# Patient Record
Sex: Male | Born: 1940 | Race: White | Hispanic: No | Marital: Married | Smoking: Former smoker
Health system: Southern US, Community
[De-identification: ages and names within clinical notes are randomized; demographics above are authoritative.]

## PROBLEM LIST (undated history)

## (undated) DIAGNOSIS — Z972 Presence of dental prosthetic device (complete) (partial): Secondary | ICD-10-CM

## (undated) DIAGNOSIS — K5010 Crohn's disease of large intestine without complications: Secondary | ICD-10-CM

## (undated) DIAGNOSIS — R51 Headache: Secondary | ICD-10-CM

## (undated) DIAGNOSIS — N400 Enlarged prostate without lower urinary tract symptoms: Secondary | ICD-10-CM

## (undated) DIAGNOSIS — K52832 Lymphocytic colitis: Secondary | ICD-10-CM

## (undated) DIAGNOSIS — G4700 Insomnia, unspecified: Secondary | ICD-10-CM

## (undated) DIAGNOSIS — B399 Histoplasmosis, unspecified: Secondary | ICD-10-CM

## (undated) DIAGNOSIS — R519 Headache, unspecified: Secondary | ICD-10-CM

## (undated) DIAGNOSIS — E785 Hyperlipidemia, unspecified: Secondary | ICD-10-CM

## (undated) DIAGNOSIS — K219 Gastro-esophageal reflux disease without esophagitis: Secondary | ICD-10-CM

## (undated) DIAGNOSIS — K5792 Diverticulitis of intestine, part unspecified, without perforation or abscess without bleeding: Secondary | ICD-10-CM

## (undated) HISTORY — PX: OTHER SURGICAL HISTORY: SHX169

## (undated) HISTORY — PX: EYE SURGERY: SHX253

## (undated) HISTORY — PX: ESOPHAGOGASTRODUODENOSCOPY: SHX1529

## (undated) HISTORY — PX: DIAGNOSTIC LAPAROSCOPY: SUR761

---

## 2009-02-03 ENCOUNTER — Ambulatory Visit: Payer: Self-pay | Admitting: Gastroenterology

## 2012-05-08 ENCOUNTER — Ambulatory Visit: Payer: Self-pay | Admitting: Gastroenterology

## 2014-06-27 DIAGNOSIS — K5010 Crohn's disease of large intestine without complications: Secondary | ICD-10-CM

## 2014-06-27 HISTORY — DX: Crohn's disease of large intestine without complications: K50.10

## 2015-02-28 ENCOUNTER — Emergency Department
Admission: EM | Admit: 2015-02-28 | Discharge: 2015-02-28 | Disposition: A | Discharge status: Home / Self Care | Payer: Medicare Other | Attending: Emergency Medicine | Admitting: Emergency Medicine

## 2015-02-28 ENCOUNTER — Encounter: Payer: Self-pay | Admitting: Emergency Medicine

## 2015-02-28 ENCOUNTER — Emergency Department: Payer: Medicare Other

## 2015-02-28 DIAGNOSIS — K6389 Other specified diseases of intestine: Secondary | ICD-10-CM | POA: Diagnosis not present

## 2015-02-28 DIAGNOSIS — R109 Unspecified abdominal pain: Secondary | ICD-10-CM | POA: Diagnosis present

## 2015-02-28 DIAGNOSIS — M549 Dorsalgia, unspecified: Secondary | ICD-10-CM | POA: Diagnosis not present

## 2015-02-28 HISTORY — DX: Diverticulitis of intestine, part unspecified, without perforation or abscess without bleeding: K57.92

## 2015-02-28 LAB — COMPREHENSIVE METABOLIC PANEL
ALK PHOS: 70 U/L (ref 38–126)
ALT: 18 U/L (ref 17–63)
ANION GAP: 9 (ref 5–15)
AST: 26 U/L (ref 15–41)
Albumin: 3.8 g/dL (ref 3.5–5.0)
BILIRUBIN TOTAL: 0.2 mg/dL — AB (ref 0.3–1.2)
BUN: 13 mg/dL (ref 6–20)
CALCIUM: 9.6 mg/dL (ref 8.9–10.3)
CO2: 28 mmol/L (ref 22–32)
Chloride: 101 mmol/L (ref 101–111)
Creatinine, Ser: 0.87 mg/dL (ref 0.61–1.24)
Glucose, Bld: 88 mg/dL (ref 65–99)
Potassium: 4.5 mmol/L (ref 3.5–5.1)
Sodium: 138 mmol/L (ref 135–145)
TOTAL PROTEIN: 6.7 g/dL (ref 6.5–8.1)

## 2015-02-28 LAB — LIPASE, BLOOD: Lipase: 23 U/L (ref 22–51)

## 2015-02-28 LAB — BRAIN NATRIURETIC PEPTIDE: B NATRIURETIC PEPTIDE 5: 165 pg/mL — AB (ref 0.0–100.0)

## 2015-02-28 MED ORDER — IOHEXOL 240 MG/ML SOLN
25.0000 mL | Freq: Once | INTRAMUSCULAR | Status: AC | PRN
  Administered 2015-02-28: 25 mL via ORAL

## 2015-02-28 MED ORDER — IOHEXOL 300 MG/ML  SOLN
100.0000 mL | Freq: Once | INTRAMUSCULAR | Status: AC | PRN
  Administered 2015-02-28: 100 mL via INTRAVENOUS

## 2015-02-28 NOTE — ED Provider Notes (Signed)
Time Seen: Approximately ----------------------------------------- 12:40 PM on 02/28/2015 -----------------------------------------   I have reviewed the triage notes  Chief Complaint: Abdominal Pain   History of Present Illness: Richard Craig is a 74 y.o. male who is been under treatment for diverticulitis. Patient states he had an episode 6-7 years ago which was his first flare up. He states he's been having similar symptoms now over the past month. He finished a 10 day course of Flagyl and Cipro and he was off of the medication for one to 2 weeks. He states on the 29th he was restarted on Cipro and Flagyl. States over the last several days that the pain and symptoms seem to be getting worse. General he describes back pain and crampy diffuse abdominal pain especially around the periumbilical area. He is not aware of any fever at home. He denies any dysuria, hematuria, or urinary frequency. He denies any melena or hematochezia. He denies any chest pain or pleuritic component. Past Medical History  Diagnosis Date  . Diverticulitis     There are no active problems to display for this patient.   Past Surgical History  Procedure Laterality Date  . Hystoplasmosis      Past Surgical History  Procedure Laterality Date  . Hystoplasmosis      No current outpatient prescriptions on file.  Allergies:  Review of patient's allergies indicates no known allergies.  Family History: No family history on file.  Social History: Social History  Substance Use Topics  . Smoking status: Never Smoker   . Smokeless tobacco: None  . Alcohol Use: No     Review of Systems:   10 point review of systems was performed and was otherwise negative:  Constitutional: No fever Eyes: No visual disturbances ENT: No sore throat, ear pain Cardiac: No chest pain Respiratory: No shortness of breath, wheezing, or stridor Abdomen: No abdominal pain, no vomiting, No diarrhea Endocrine: No weight loss,  No night sweats Extremities: No peripheral edema, cyanosis Skin: No rashes, easy bruising Neurologic: No focal weakness, trouble with speech or swollowing Urologic: No dysuria, Hematuria, or urinary frequency   Physical Exam:  ED Triage Vitals  Enc Vitals Group     BP 02/28/15 1159 159/80 mmHg     Pulse Rate 02/28/15 1159 66     Resp 02/28/15 1159 16     Temp 02/28/15 1159 97.7 F (36.5 C)     Temp Source 02/28/15 1159 Oral     SpO2 02/28/15 1159 99 %     Weight 02/28/15 1159 170 lb (77.111 kg)     Height 02/28/15 1159 5' 10"  (1.778 m)     Head Cir --      Peak Flow --      Pain Score 02/28/15 1159 2     Pain Loc --      Pain Edu? --      Excl. in [place]? --     General: Awake , Alert , and Oriented times 3; GCS 15 Head: Normal cephalic , atraumatic Eyes: Pupils equal , round, reactive to light Nose/Throat: No nasal drainage, patent upper airway without erythema or exudate.  Neck: Supple, Full range of motion, No anterior adenopathy or palpable thyroid masses Lungs: Clear to ascultation without wheezes , rhonchi, or rales Heart: Regular rate, regular rhythm without murmurs , gallops , or rubs Abdomen: Benign abdominal exam but no obvious rebound guarding or rigidity. Bowel sounds are positive somewhat hyperactive and symmetric in all 4 quadrants. No organomegaly .  Extremities: 2 plus symmetric pulses. No edema, clubbing or cyanosis Neurologic: normal ambulation, Motor symmetric without deficits, sensory intact Skin: warm, dry, no rashes   Labs:   All laboratory work was reviewed including any pertinent negatives or positives listed below:  Labs Reviewed  [place], BLOOD   CBC was pending at disposition.    Radiology  CT ABDOMEN AND PELVIS WITH CONTRAST  TECHNIQUE: Multidetector CT imaging of the abdomen and pelvis was performed using the standard protocol following bolus administration of intravenous  contrast.  CONTRAST: 14m OMNIPAQUE IOHEXOL 300 MG/ML SOLN  COMPARISON: None.  FINDINGS: Small calcified granulomata in the spleen. Small gallstones in the gallbladder, measuring up to 3 mm in diameter each. No gallbladder wall thickening or pericholecystic fluid.  Irregular mass-like area of soft tissue density in the right lower quadrant abdominal mesenteric knee with ill-defined increased density in the surrounding fat. This area measures 4.0 x 3.8 cm on image number 53. On coronal image number 50, this measures 3.4 cm in cephalocaudal dimension. 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 There is also eccentric wall thickening involving the cecum and inferior right colon.  No enlarged lymph nodes. Small bilateral renal cysts. 5 mm semi-solid nodule in the right lower lobe on image number 7. Two tiny subpleural nodules in the left lower lobe on image 8. The lungs are hyperexpanded at the lung bases with bullous changes.  Small liver cysts. Unremarkable the pancreas adrenal glands, ureters and urinary bladder. Mildly enlarged prostate gland. Right inguinal hernia containing fat. Multiple sigmoid colon diverticula without evidence of diverticulitis. Lumbar and lower thoracic spine degenerative changes.  IMPRESSION: 1. 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 This combination of findings is most compatible with an inflammatory process such as Crohn's disease. A neoplastic process, such  as a carcinoid tumor, is less likely. 2. 5 mm right lower lobe nodule and 2 tiny subpleural nodules in the left lower lobe. If the patient is at high risk for bronchogenic carcinoma, follow-up chest CT at 6-12 months is recommended. If the patient is at low risk for bronchogenic carcinoma, follow-up chest CT at 12 months is recommended. This recommendation follows the consensus statement: Guidelines for Management of Small Pulmonary Nodules Detected on CT Scans: A Statement from the [place] published in Radiology 2005;237:395-400. 3. Changes of COPD.        I personally reviewed the radiologic studies   Procedures:  Patient's stay was uneventful he tolerated his CAT scan well. There is no evidence of acute diverticulitis at this time.   ED Course: I reviewed the case with Dr. [facility] on call for unassigned patients. He was felt the next appropriate step would be a colonoscopy to see if we can visualize the lesion is located on the right side in his colon. Unfortunately this may be neoplastic in nature given the irregular surface etc. Patient is aware and discussed the differential and gave him a copy of the CAT scan reading the take with them. Then  advised to call on Tuesday for follow-up as soon as possible. Return here if he has any other new concerns such as bloody stool, persistent nausea or vomiting, or any other new concerns.    Assessment:  Colonic mass: An inflammatory bowel disease versus neoplastic     Plan:  Patient was advised to return immediately if condition worsens. Patient was advised to follow up with her primary care physician or other specialized physicians involved and in their current assessment.             Daymon Larsen, MD 02/28/15 [phone]

## 2015-02-28 NOTE — ED Notes (Signed)
Discharge instructions and follow-up care reviewed with patient and spouse. No questions or concerns at this time.

## 2015-02-28 NOTE — ED Notes (Signed)
Pt to ED c/o increased abdominal pain. States he's had diverticulitis 6-7 years ago, first flare-up started approx. 1 month ago. Pt finished 10-day course of Flagyl/Cipro, was off for 1-2 weeks, and recently restarted with increased dosages which did not relieve discomfort. Pain currently 2/10 to LLQ. No c/o n/v/d.

## 2015-02-28 NOTE — ED Notes (Signed)
Pt reports taking 2 doses of cipro and flagyl (for diverticulitis, hx of the same) and continued lower abdominal pain. Pt denies any blood in stool, reports some constipation. Pt sent here from [facility] for CT scan.

## 2015-03-06 ENCOUNTER — Encounter: Payer: Self-pay | Admitting: Anesthesiology

## 2015-03-06 ENCOUNTER — Encounter
Admission: RE | Disposition: A | Discharge status: Home Health | Payer: Self-pay | Source: Ambulatory Visit | Attending: Gastroenterology

## 2015-03-06 ENCOUNTER — Ambulatory Visit
Admission: RE | Admit: 2015-03-06 | Discharge: 2015-03-06 | Disposition: A | Discharge status: Home Health | Payer: Medicare Other | Source: Ambulatory Visit | Attending: Gastroenterology | Admitting: Gastroenterology

## 2015-03-06 ENCOUNTER — Ambulatory Visit: Payer: Medicare Other | Admitting: Anesthesiology

## 2015-03-06 DIAGNOSIS — N529 Male erectile dysfunction, unspecified: Secondary | ICD-10-CM | POA: Diagnosis not present

## 2015-03-06 DIAGNOSIS — K5730 Diverticulosis of large intestine without perforation or abscess without bleeding: Secondary | ICD-10-CM | POA: Insufficient documentation

## 2015-03-06 DIAGNOSIS — R1030 Lower abdominal pain, unspecified: Secondary | ICD-10-CM | POA: Insufficient documentation

## 2015-03-06 DIAGNOSIS — L308 Other specified dermatitis: Secondary | ICD-10-CM | POA: Insufficient documentation

## 2015-03-06 DIAGNOSIS — J449 Chronic obstructive pulmonary disease, unspecified: Secondary | ICD-10-CM | POA: Diagnosis not present

## 2015-03-06 DIAGNOSIS — Z8719 Personal history of other diseases of the digestive system: Secondary | ICD-10-CM | POA: Diagnosis not present

## 2015-03-06 DIAGNOSIS — K529 Noninfective gastroenteritis and colitis, unspecified: Secondary | ICD-10-CM | POA: Diagnosis not present

## 2015-03-06 DIAGNOSIS — K648 Other hemorrhoids: Secondary | ICD-10-CM | POA: Insufficient documentation

## 2015-03-06 DIAGNOSIS — Z79899 Other long term (current) drug therapy: Secondary | ICD-10-CM | POA: Diagnosis not present

## 2015-03-06 DIAGNOSIS — E785 Hyperlipidemia, unspecified: Secondary | ICD-10-CM | POA: Diagnosis not present

## 2015-03-06 DIAGNOSIS — K620 Anal polyp: Secondary | ICD-10-CM | POA: Insufficient documentation

## 2015-03-06 DIAGNOSIS — Z8371 Family history of colonic polyps: Secondary | ICD-10-CM | POA: Diagnosis not present

## 2015-03-06 DIAGNOSIS — Z800 Family history of malignant neoplasm of digestive organs: Secondary | ICD-10-CM | POA: Insufficient documentation

## 2015-03-06 DIAGNOSIS — Z7982 Long term (current) use of aspirin: Secondary | ICD-10-CM | POA: Diagnosis not present

## 2015-03-06 DIAGNOSIS — K621 Rectal polyp: Secondary | ICD-10-CM | POA: Insufficient documentation

## 2015-03-06 HISTORY — PX: COLONOSCOPY WITH PROPOFOL: SHX5780

## 2015-03-06 SURGERY — COLONOSCOPY WITH PROPOFOL
Anesthesia: General

## 2015-03-06 MED ORDER — PROPOFOL 10 MG/ML IV BOLUS
INTRAVENOUS | Status: DC | PRN
  Administered 2015-03-06: 30 mg via INTRAVENOUS
  Administered 2015-03-06: 70 mg via INTRAVENOUS

## 2015-03-06 MED ORDER — PROPOFOL INFUSION 10 MG/ML OPTIME
INTRAVENOUS | Status: DC | PRN
  Administered 2015-03-06: 120 ug/kg/min via INTRAVENOUS

## 2015-03-06 MED ORDER — SODIUM CHLORIDE 0.9 % IV SOLN
INTRAVENOUS | Status: DC
  Administered 2015-03-06: 1000 mL via INTRAVENOUS

## 2015-03-06 MED ORDER — LIDOCAINE HCL (CARDIAC) 20 MG/ML IV SOLN
INTRAVENOUS | Status: DC | PRN
  Administered 2015-03-06: 100 mg via INTRAVENOUS

## 2015-03-06 NOTE — H&P (Signed)
Outpatient short stay form Pre-procedure 03/06/2015 12:34 PM Lollie Sails MD  Primary Physician: Dr. [place] Pink  Reason for visit:  Colonoscopy  History of present illness:  Patient is a 74 year old male presenting after a bout of Donnell pain occurred a month ago. He was treated for diverticulitis with 2 courses of anti-biotics that did not seem to resolve the issue. He then went to the emergency room bout 6 days ago where CT scan was done that CT scan showing abnormality in the terminal ileum as well as cecum region. He did have a colonoscopy 03/01/2012 which showed only diverticulosis. There are several relatives however with colon cancer and history of colon polyps.  He tolerated his prep well. It is been several days since he took any aspirin products. Takes no anticoagulation drugs otherwise.    Current facility-administered medications:  .  0.9 %  sodium chloride infusion, , Intravenous, Continuous, Lollie Sails, MD, Last Rate: 20 mL/hr at 03/06/15 1157, 1,000 mL at 03/06/15 1157  Prescriptions prior to admission  Medication Sig Dispense Refill Last Dose  . aspirin 81 MG tablet Take 81 mg by mouth daily.     . clorazepate (TRANXENE) 15 MG tablet Take 15 mg by mouth 2 (two) times daily.     Marland Kitchen geriatric multivitamins-minerals (ELDERTONIC/GEVRABON) ELIX Take 15 mLs by mouth daily.     . hydrocortisone (ANUSOL-HC) 2.5 % rectal cream Place 1 application rectally 2 (two) times daily.     Ernestine Conrad FATTY ACIDS-PHYTOSTEROLS PO Take by mouth.     . simvastatin (ZOCOR) 40 MG tablet Take 40 mg by mouth daily.     . SUMAtriptan (IMITREX) 100 MG tablet Take 100 mg by mouth every 2 (two) hours as needed for migraine. May repeat in 2 hours if headache persists or recurs.   03/06/2015 at 0930  . tadalafil (CIALIS) 20 MG tablet Take 20 mg by mouth daily as needed for erectile dysfunction.        No Known Allergies   Past Medical History  Diagnosis Date  . Diverticulitis     Review  of systems:      Physical Exam    Heart and lungs: Regular rate and rhythm without rub or gallop, lungs are bilaterally clear    HEENT: Normocephalic atraumatic eyes are anicteric    Other: A rectal examination shows 1 small spot to the left of the anal orifice is minimally inflamed. However there is a large area of apparent fungal dermatitis around the rectum.    Pertinant exam for procedure: Soft nontender nondistended bowel sounds positive normoactive    Planned proceedures: Colonoscopy and indicated procedures. I have discussed the risks benefits and complications of procedures to include not limited to bleeding, infection, perforation and the risk of sedation and the patient wishes to proceed.  Lollie Sails, MD  12:38 PM 03/06/2015    Lollie Sails, MD Gastroenterology 03/06/2015  12:34 PM

## 2015-03-06 NOTE — Anesthesia Preprocedure Evaluation (Addendum)
Anesthesia Evaluation  Patient identified by MRN, date of birth, ID band Patient awake    Reviewed: Allergy & Precautions, H&P , NPO status , Patient's Chart, lab work & pertinent test results  History of Anesthesia Complications Negative for: history of anesthetic complications  Airway Mallampati: II  TM Distance: >3 FB Neck ROM: full    Dental  (+) Missing, Poor Dentition, Partial Lower, Partial Upper   Pulmonary neg shortness of breath, COPD,    Pulmonary exam normal breath sounds clear to auscultation       Cardiovascular Exercise Tolerance: Good (-) Past MI negative cardio ROS Normal cardiovascular exam Rhythm:regular Rate:Normal     Neuro/Psych negative neurological ROS  negative psych ROS   GI/Hepatic negative GI ROS, Neg liver ROS, GERD  Controlled,  Endo/Other  negative endocrine ROS  Renal/GU negative Renal ROS  negative genitourinary   Musculoskeletal   Abdominal   Peds  Hematology negative hematology ROS (+)   Anesthesia Other Findings Past Medical History:   Diverticulitis   HLD                      Intestinal mass   GERD                            Reproductive/Obstetrics negative OB ROS                            Anesthesia Physical Anesthesia Plan  ASA: III  Anesthesia Plan: General   Post-op Pain Management:    Induction:   Airway Management Planned:   Additional Equipment:   Intra-op Plan:   Post-operative Plan:   Informed Consent: I have reviewed the patients History and Physical, chart, labs and discussed the procedure including the risks, benefits and alternatives for the proposed anesthesia with the patient or authorized representative who has indicated his/her understanding and acceptance.   Dental Advisory Given  Plan Discussed with: Anesthesiologist, CRNA and Surgeon  Anesthesia Plan Comments:         Anesthesia Quick Evaluation

## 2015-03-06 NOTE — Anesthesia Postprocedure Evaluation (Signed)
  Anesthesia Post-op Note  Patient: Richard Craig  Procedure(s) Performed: Procedure(s): COLONOSCOPY WITH PROPOFOL (N/A)  Anesthesia type:General  Patient location: PACU  Post pain: Pain level controlled  Post assessment: Post-op Vital signs reviewed, Patient's Cardiovascular Status Stable, Respiratory Function Stable, Patent Airway and No signs of Nausea or vomiting  Post vital signs: Reviewed and stable  Last Vitals:  Filed Vitals:   03/06/15 1415  BP: 148/85  Pulse: 58  Temp:   Resp: 18    Level of consciousness: awake, alert  and patient cooperative  Complications: Patient with hoarse voice and sore throat which improves with drinking liquids.  Lungs clear.  No shortness of breath, no dermal involvement and patient is hemodynamically stable.   This is thought to be due to dry air flow from nasal canula and mouth breathing as patient was not intubated and airway was not instrumented.  Patient and wife instructed to contact a doctor or call 911 if this worsens or does not improve.  They voiced understanding.

## 2015-03-06 NOTE — Transfer of Care (Signed)
Immediate Anesthesia Transfer of Care Note  Patient: Richard Craig  Procedure(s) Performed: Procedure(s): COLONOSCOPY WITH PROPOFOL (N/A)  Patient Location: Endoscopy Unit  Anesthesia Type:General  Level of Consciousness: sedated  Airway & Oxygen Therapy: Patient Spontanous Breathing and Patient connected to nasal cannula oxygen  Post-op Assessment: Report given to RN and Post -op Vital signs reviewed and stable  Post vital signs: Reviewed and stable  Last Vitals:  Filed Vitals:   03/06/15 1143  BP: 152/84  Temp: 32.6 C    Complications: No apparent anesthesia complications

## 2015-03-06 NOTE — Op Note (Addendum)
[facility] Patient Name: Richard Craig Procedure Date: 03/06/2015 12:32 PM MRN: [id number] Account #: [id number] Date of Birth: May 09, 1941 Admit Type: Outpatient Age: 74 Room: [facility] ENDO ROOM 1 Gender: Male Note Status: Supervisor Override Procedure:         Colonoscopy Indications:       Lower abdominal pain, Abnormal CT of the GI tract Providers:         Lollie Sails, MD Referring MD:      Irven Easterly. Kary Kos, MD (Referring MD) Medicines:         Monitored Anesthesia Care Complications:     No immediate complications. Procedure:         Pre-Anesthesia Assessment:                    - ASA Grade Assessment: III - A patient with severe                     systemic disease.                    After obtaining informed consent, the colonoscope was                     passed under direct vision. Throughout the procedure, the                     patient's blood pressure, pulse, and oxygen saturations                     were monitored continuously. The Colonoscope was                     introduced through the anus and advanced to the the cecum,                     identified by the appendiceal orifice. The colonoscopy was                     performed with moderate difficulty due to poor bowel prep                     and significant looping. Successful completion of the                     procedure was aided by changing the patient to a supine                     position, using manual pressure and lavage. The patient                     tolerated the procedure well. Findings:      Multiple small to medium diverticula were found in the sigmoid colon and       in the descending colon.      Diffuse and scattered mild inflammation characterized by congestion       (edema) and aphthous ulcerations was found in the entire colon. Biopsies       for histology were taken with a cold forceps from the cecum, right colon       and left colon for evaluation  of microscopic colitis.      The digital rectal exam was normal.      The scope was advanced to the apparent appendiceal orifice. However  despite careful searching, an ileo-cecal opening could not be found. The       apparent cecum showed vascular fragility.      - three spots of mild submucosal ecchymosis were noted, at the distal       sigmoid and anal orifice without other defect.      The perianal exam findings include a perianal fungal rash.      A 4 mm polyp was found at 22 cm proximal to the anus. The polyp was       sessile. The polyp was removed with a cold biopsy forceps. Resection and       retrieval were complete.      Two sessile polyps were found in the rectum. The polyps were 2 mm in       size. These polyps were removed with a cold biopsy forceps. Resection       and retrieval were complete.      Non-bleeding internal hemorrhoids were found during retroflexion. The       hemorrhoids were small. Impression:        - Colitis suspected. Possible high grade stenosis in the                     ascending colon, unable tot advance further due to this. Recommendation:    - Discharge patient to home.                    - Perform an air contrast barium enema in 2 weeks.                    - Return to GI office in 2 weeks.                    - Diflucan (fluconazole) 100 mg PO daily for 5 days. Procedure Code(s): --- Professional ---                    [phone], Colonoscopy, flexible; with biopsy, single or                     multiple CPT copyright 2014 [employer]. All rights reserved. The codes documented in this report are preliminary and upon coder review may  be revised to meet current compliance requirements. Lollie Sails, MD 03/06/2015 1:45:23 PM This report has been signed electronically. Number of Addenda: 0 Note Initiated On: 03/06/2015 12:32 PM Scope Withdrawal Time: 0 hours 27 minutes 36 seconds  Total Procedure Duration: 0 hours 41 minutes 52  seconds       [facility]

## 2015-03-10 LAB — SURGICAL PATHOLOGY

## 2015-03-13 ENCOUNTER — Ambulatory Visit: Admit: 2015-03-13 | Payer: Self-pay | Admitting: Gastroenterology

## 2015-03-13 SURGERY — COLONOSCOPY WITH PROPOFOL
Anesthesia: General

## 2015-03-16 ENCOUNTER — Other Ambulatory Visit: Payer: Self-pay | Admitting: Gastroenterology

## 2015-03-16 DIAGNOSIS — R1030 Lower abdominal pain, unspecified: Secondary | ICD-10-CM

## 2015-03-20 ENCOUNTER — Encounter: Payer: Self-pay | Admitting: Gastroenterology

## 2015-03-20 ENCOUNTER — Other Ambulatory Visit: Payer: Self-pay | Admitting: Gastroenterology

## 2015-03-20 ENCOUNTER — Ambulatory Visit
Admission: RE | Admit: 2015-03-20 | Discharge: 2015-03-20 | Disposition: A | Discharge status: Home / Self Care | Payer: Medicare Other | Source: Ambulatory Visit | Attending: Gastroenterology | Admitting: Gastroenterology

## 2015-03-20 DIAGNOSIS — R1030 Lower abdominal pain, unspecified: Secondary | ICD-10-CM | POA: Diagnosis not present

## 2015-03-25 LAB — INFLAMMATORY BOWEL DISEASE-IBD

## 2015-03-31 DIAGNOSIS — K50819 Crohn's disease of both small and large intestine with unspecified complications: Secondary | ICD-10-CM | POA: Insufficient documentation

## 2015-06-12 ENCOUNTER — Emergency Department
Admission: EM | Admit: 2015-06-12 | Discharge: 2015-06-12 | Disposition: A | Discharge status: Home / Self Care | Payer: Medicare Other | Attending: Emergency Medicine | Admitting: Emergency Medicine

## 2015-06-12 ENCOUNTER — Encounter: Payer: Self-pay | Admitting: Medical Oncology

## 2015-06-12 ENCOUNTER — Emergency Department: Payer: Medicare Other

## 2015-06-12 DIAGNOSIS — Z79899 Other long term (current) drug therapy: Secondary | ICD-10-CM | POA: Diagnosis not present

## 2015-06-12 DIAGNOSIS — K5010 Crohn's disease of large intestine without complications: Secondary | ICD-10-CM | POA: Insufficient documentation

## 2015-06-12 DIAGNOSIS — K50119 Crohn's disease of large intestine with unspecified complications: Secondary | ICD-10-CM

## 2015-06-12 DIAGNOSIS — R109 Unspecified abdominal pain: Secondary | ICD-10-CM | POA: Diagnosis present

## 2015-06-12 DIAGNOSIS — Z7952 Long term (current) use of systemic steroids: Secondary | ICD-10-CM | POA: Insufficient documentation

## 2015-06-12 DIAGNOSIS — Z7982 Long term (current) use of aspirin: Secondary | ICD-10-CM | POA: Insufficient documentation

## 2015-06-12 LAB — CBC WITH DIFFERENTIAL/PLATELET
BASOS ABS: 0 10*3/uL (ref 0–0.1)
BASOS PCT: 0 %
Eosinophils Absolute: 0.1 10*3/uL (ref 0–0.7)
Eosinophils Relative: 1 %
HEMATOCRIT: 41.9 % (ref 40.0–52.0)
HEMOGLOBIN: 13.5 g/dL (ref 13.0–18.0)
LYMPHS PCT: 11 %
Lymphs Abs: 1.6 10*3/uL (ref 1.0–3.6)
MCH: 27.2 pg (ref 26.0–34.0)
MCHC: 32.3 g/dL (ref 32.0–36.0)
MCV: 84.2 fL (ref 80.0–100.0)
Monocytes Absolute: 1.2 10*3/uL — ABNORMAL HIGH (ref 0.2–1.0)
Monocytes Relative: 8 %
NEUTROS ABS: 11.7 10*3/uL — AB (ref 1.4–6.5)
NEUTROS PCT: 80 %
Platelets: 412 10*3/uL (ref 150–440)
RBC: 4.98 MIL/uL (ref 4.40–5.90)
RDW: 17.2 % — AB (ref 11.5–14.5)
WBC: 14.6 10*3/uL — AB (ref 3.8–10.6)

## 2015-06-12 LAB — URINALYSIS COMPLETE WITH MICROSCOPIC (ARMC ONLY)
BACTERIA UA: NONE SEEN
Bilirubin Urine: NEGATIVE
Glucose, UA: NEGATIVE mg/dL
Hgb urine dipstick: NEGATIVE
Ketones, ur: NEGATIVE mg/dL
LEUKOCYTES UA: NEGATIVE
Nitrite: NEGATIVE
PH: 8 (ref 5.0–8.0)
PROTEIN: NEGATIVE mg/dL
RBC / HPF: NONE SEEN RBC/hpf (ref 0–5)
SQUAMOUS EPITHELIAL / LPF: NONE SEEN
Specific Gravity, Urine: 1.006 (ref 1.005–1.030)
WBC UA: NONE SEEN WBC/hpf (ref 0–5)

## 2015-06-12 LAB — CBC
HCT: 43 % (ref 40.0–52.0)
Hemoglobin: 13.5 g/dL (ref 13.0–18.0)
MCH: 26.4 pg (ref 26.0–34.0)
MCHC: 31.5 g/dL — ABNORMAL LOW (ref 32.0–36.0)
MCV: 83.8 fL (ref 80.0–100.0)
PLATELETS: 457 10*3/uL — AB (ref 150–440)
RBC: 5.12 MIL/uL (ref 4.40–5.90)
RDW: 17.2 % — ABNORMAL HIGH (ref 11.5–14.5)
WBC: 15.8 10*3/uL — AB (ref 3.8–10.6)

## 2015-06-12 LAB — COMPREHENSIVE METABOLIC PANEL
ALT: 19 U/L (ref 17–63)
AST: 18 U/L (ref 15–41)
Albumin: 3.4 g/dL — ABNORMAL LOW (ref 3.5–5.0)
Alkaline Phosphatase: 89 U/L (ref 38–126)
Anion gap: 10 (ref 5–15)
BILIRUBIN TOTAL: 0.5 mg/dL (ref 0.3–1.2)
BUN: 7 mg/dL (ref 6–20)
CO2: 27 mmol/L (ref 22–32)
CREATININE: 0.83 mg/dL (ref 0.61–1.24)
Calcium: 9.9 mg/dL (ref 8.9–10.3)
Chloride: 101 mmol/L (ref 101–111)
GFR calc Af Amer: >60 mL/min (ref >60)
Glucose, Bld: 173 mg/dL — ABNORMAL HIGH (ref 65–99)
Potassium: 4 mmol/L (ref 3.5–5.1)
Sodium: 138 mmol/L (ref 135–145)
TOTAL PROTEIN: 7 g/dL (ref 6.5–8.1)

## 2015-06-12 LAB — LIPASE, BLOOD: Lipase: 18 U/L (ref 11–51)

## 2015-06-12 MED ORDER — MORPHINE SULFATE (PF) 4 MG/ML IV SOLN
6.0000 mg | Freq: Once | INTRAVENOUS | Status: AC
  Administered 2015-06-12: 6 mg via INTRAVENOUS
  Filled 2015-06-12: qty 1

## 2015-06-12 MED ORDER — PREDNISONE 20 MG PO TABS: 40.0000 mg | ORAL_TABLET | Freq: Every day | ORAL | Status: AC

## 2015-06-12 MED ORDER — HYDROMORPHONE HCL 1 MG/ML IJ SOLN
1.5000 mg | Freq: Once | INTRAMUSCULAR | Status: AC
  Administered 2015-06-12: 1.5 mg via INTRAVENOUS
  Filled 2015-06-12: qty 2

## 2015-06-12 MED ORDER — ONDANSETRON HCL 4 MG/2ML IJ SOLN
4.0000 mg | Freq: Once | INTRAMUSCULAR | Status: AC
Start: 2015-06-12 — End: 2015-06-12
  Administered 2015-06-12: 4 mg via INTRAVENOUS
  Filled 2015-06-12: qty 2

## 2015-06-12 MED ORDER — OXYCODONE-ACETAMINOPHEN 5-325 MG PO TABS
2.0000 | ORAL_TABLET | Freq: Once | ORAL | Status: AC
  Administered 2015-06-12: 2 via ORAL
  Filled 2015-06-12: qty 2

## 2015-06-12 MED ORDER — MORPHINE SULFATE (PF) 4 MG/ML IV SOLN
6.0000 mg | Freq: Once | INTRAVENOUS | Status: AC
  Administered 2015-06-12: 6 mg via INTRAVENOUS

## 2015-06-12 MED ORDER — MORPHINE SULFATE (PF) 4 MG/ML IV SOLN
INTRAVENOUS | Status: AC
  Filled 2015-06-12: qty 2

## 2015-06-12 MED ORDER — PREDNISONE 20 MG PO TABS
60.0000 mg | ORAL_TABLET | Freq: Once | ORAL | Status: AC
  Administered 2015-06-12: 60 mg via ORAL
  Filled 2015-06-12: qty 3

## 2015-06-12 MED ORDER — OXYCODONE-ACETAMINOPHEN 10-325 MG PO TABS: 1.0000 | ORAL_TABLET | Freq: Four times a day (QID) | ORAL | Status: AC | PRN

## 2015-06-12 MED ORDER — MORPHINE SULFATE (PF) 4 MG/ML IV SOLN
INTRAVENOUS | Status: AC
  Administered 2015-06-12: 6 mg via INTRAVENOUS
  Filled 2015-06-12: qty 1

## 2015-06-12 NOTE — ED Provider Notes (Signed)
[facility] Emergency Department Provider Note  ____________________________________________  Time seen: Approximately 7:37 PM  I have reviewed the triage vital signs and the nursing notes.   HISTORY  Chief Complaint Abdominal Pain    HPI Richard Craig is a 74 y.o. male who was diagnosed with Crohn's disease. He had a CT here and then another one at [facility] very recently. He was going to be started on Humira because of Crohn's disease is severe however the insurance company is apparently giving them a hassle and has not approved the Humira treatment at this point. Patient is having increasing right lower quadrant pain and does not feel well. Patient was on Ultram at home without is not working. Patient's pain has been located in the right lower quadrant and is the same in character as it had been.   Past Medical History  Diagnosis Date  . Diverticulitis     There are no active problems to display for this patient.   Past Surgical History  Procedure Laterality Date  . Hystoplasmosis    . Colonoscopy with propofol N/A 03/06/2015    Procedure: COLONOSCOPY WITH PROPOFOL;  Surgeon: Lollie Sails, MD;  Location: [facility] ENDOSCOPY;  Service: Endoscopy;  Laterality: N/A;    Current Outpatient Rx  Name  Route  Sig  Dispense  Refill  . aspirin 81 MG tablet   Oral   Take 81 mg by mouth daily.         . clorazepate (TRANXENE) 15 MG tablet   Oral   Take 15 mg by mouth 2 (two) times daily.         Marland Kitchen geriatric multivitamins-minerals (ELDERTONIC/GEVRABON) ELIX   Oral   Take 15 mLs by mouth daily.         . hydrocortisone (ANUSOL-HC) 2.5 % rectal cream   Rectal   Place 1 application rectally 2 (two) times daily.         Ernestine Conrad FATTY ACIDS-PHYTOSTEROLS PO   Oral   Take by mouth.         . oxyCODONE-acetaminophen (PERCOCET) 10-325 MG tablet   Oral   Take 1 tablet by mouth every 6 (six) hours as needed for pain (This prescription is stronger than  what you got in the hospital. Please take 1/2-1 pill every 6 hours as needed.).   20 tablet   0   . predniSONE (DELTASONE) 20 MG tablet   Oral   Take 2 tablets (40 mg total) by mouth daily.   22 tablet   0   . simvastatin (ZOCOR) 40 MG tablet   Oral   Take 40 mg by mouth daily.         . SUMAtriptan (IMITREX) 100 MG tablet   Oral   Take 100 mg by mouth every 2 (two) hours as needed for migraine. May repeat in 2 hours if headache persists or recurs.         . tadalafil (CIALIS) 20 MG tablet   Oral   Take 20 mg by mouth daily as needed for erectile dysfunction.           Allergies Review of patient's allergies indicates no known allergies.  No family history on file.  Social History Social History  Substance Use Topics  . Smoking status: Never Smoker   . Smokeless tobacco: None  . Alcohol Use: No    Review of Systems Constitutional: No fever/chills Eyes: No visual changes. ENT: No sore throat. Cardiovascular: Denies chest pain. Respiratory: Denies  shortness of breath. Gastrointestinal: See history of present illness I need to add that patient has been on MiraLAX because he found the Ultram to be constipating Genitourinary: Negative for dysuria. Musculoskeletal: Negative for back pain. Skin: Negative for rash. Neurological: Negative for headaches, focal weakness or numbness.  10-point ROS otherwise negative.  ____________________________________________   PHYSICAL EXAM:  VITAL SIGNS: ED Triage Vitals  Enc Vitals Group     BP 06/12/15 1503 150/100 mmHg     Pulse Rate 06/12/15 1503 102     Resp 06/12/15 1503 22     Temp 06/12/15 1503 97.8 F (36.6 C)     Temp Source 06/12/15 1503 Oral     SpO2 06/12/15 1503 99 %     Weight 06/12/15 1503 160 lb (72.576 kg)     Height 06/12/15 1503 5' 10"  (1.778 m)     Head Cir --      Peak Flow --      Pain Score 06/12/15 1503 10     Pain Loc --      Pain Edu? --      Excl. in [place]? --     Constitutional:  Alert and oriented. Well appearing and in no acute distress. Eyes: Conjunctivae are normal. PERRL. EOMI. Head: Atraumatic. Nose: No congestion/rhinnorhea. Mouth/Throat: Mucous membranes are moist.  Oropharynx non-erythematous. Neck: No stridor.  Cardiovascular: Normal rate, regular rhythm. Grossly normal heart sounds.  Good peripheral circulation. Respiratory: Normal respiratory effort.  No retractions. Lungs CTAB. Gastrointestinal: Soft and minimally tender even to deep palpation there is no tenderness percussion and there is no rebound. No distention. No abdominal bruits. No CVA tenderness. Musculoskeletal: No lower extremity tenderness nor edema.  No joint effusions. Neurologic:  Normal speech and language. No gross focal neurologic deficits are appreciated. No gait instability. Skin:  Skin is warm, dry and intact. No rash noted. Psychiatric: Mood and affect are normal. Speech and behavior are normal.  ____________________________________________   LABS (all labs ordered are listed, but only abnormal results are displayed)  Labs Reviewed  COMPREHENSIVE METABOLIC PANEL - Abnormal; Notable for the following:    Glucose, Bld 173 (*)    Albumin 3.4 (*)    All other components within normal limits  CBC - Abnormal; Notable for the following:    WBC 15.8 (*)    MCHC 31.5 (*)    RDW 17.2 (*)    Platelets 457 (*)    All other components within normal limits  URINALYSIS COMPLETEWITH MICROSCOPIC ([facility] ONLY) - Abnormal; Notable for the following:    Color, Urine YELLOW (*)    APPearance CLEAR (*)    All other components within normal limits  CBC WITH DIFFERENTIAL/PLATELET - Abnormal; Notable for the following:    WBC 14.6 (*)    RDW 17.2 (*)    Neutro Abs 11.7 (*)    Monocytes Absolute 1.2 (*)    All other components within normal limits  LIPASE, BLOOD   ____________________________________________  EKG   ____________________________________________  RADIOLOGY  X-rays read  by radiology and reviewed by me show 1-2 air-fluid levels no signs of obstruction. Patient's pain was controlled with pain medicine and his white blood count actually decreased while he was in the emergency room. I elected not to CT him as with his cousins disease I imagine he will get repeated CTs the next few years. ____________________________________________   PROCEDURES  Patient understands very well with I will treat him with prednisone for his Crohn's disease he will follow-up  with Dr. Gustavo Lah gastroenterology who recommended the prednisone. After sk[place]ie will see him in about a week. Patient knows to return for worse pain vomiting fever or any others worsening at all.  ____________________________________________   INITIAL IMPRESSION / ASSESSMENT AND PLAN / ED COURSE  Pertinent labs & imaging results that were available during my care of the patient were reviewed by me and considered in my medical decision making (see chart for details).   ____________________________________________   FINAL CLINICAL IMPRESSION(S) / ED DIAGNOSES  Final diagnoses:  Crohn's colitis, unspecified complication ([place])      Nena Polio, MD 06/12/15 2155

## 2015-06-12 NOTE — Discharge Instructions (Signed)
Crohn Disease Crohn disease is a long-lasting (chronic) disease that affects your gastrointestinal (GI) tract. It often causes irritation and swelling (inflammation) in your small intestine and the beginning of your large intestine. However, it can affect any part of your GI tract. Crohn disease is part of a group of illnesses that are known as inflammatory bowel disease (IBD). Crohn disease may start slowly and get worse over time. Symptoms may come and go. They may also disappear for months or even years at a time (remission). CAUSES The exact cause of Crohn disease is not known. It may be a response that causes your body's defense system (immune system) to mistakenly attack healthy cells and tissues (autoimmune response). Your genes and your environment may also play a role. RISK FACTORS You may be at greater risk for Crohn disease if you:  Have other family members with Crohn disease or another IBD.  Use any tobacco products, including cigarettes, chewing tobacco, or electronic cigarettes.  Are in your 17s.  Have [place] European ancestry. SIGNS AND SYMPTOMS The main signs and symptoms of Crohn disease involve your GI tract. These include:  Diarrhea.  Rectal bleeding.  An urgent need to move your bowels.  The feeling that you are not finished having a bowel movement.  Abdominal pain or cramping.  Constipation. General signs and symptoms of Crohn disease may also include:  Unexplained weight loss.  Fatigue.  Fever.  Nausea.  Loss of appetite.  Joint pain  Changes in vision.  Red bumps on your skin. DIAGNOSIS Your health care provider may suspect Crohn disease based on your symptoms and your medical history. Your health care provider will do a physical exam. You may need to see a health care provider who specializes in diseases of the digestive tract (gastroenterologist). You may also have tests to help your health care providers make a diagnosis. These may  include:  Blood tests.  Stool sample tests.  Imaging tests, such as X-rays and CT scans.  Tests to examine the inside of your intestines using a long, flexible tube that has a light and a camera on the end (endoscopy or colonoscopy).  A procedure to take tissue samples from inside your bowel (biopsy) to be examined under a microscope. TREATMENT  There is no cure for Crohn disease. Treatment will focus on managing your symptoms. Crohn disease affects each person differently. Your treatment may include:  Resting your bowels. Drinking only clear liquids or getting nutrition through an IV for a period of time gives your bowels a chance to heal because they are not passing stools.  Medicines. These may be used alone or in combination (combination therapy). These may include antibiotic medicines. You may be given medicines that help to:  Reduce inflammation.  Control your immune system activity.  Fight infections.  Relieve cramps and prevent diarrhea.  Control your pain.  Surgery. You may need surgery if:  Medicines and other treatments are no longer working.  You develop complications from severe Crohn disease.  A section of your intestine becomes so damaged that it needs to be removed. HOME CARE INSTRUCTIONS  Take medicines only as directed by your health care provider.  If you were prescribed an antibiotic medicine, finish it all even if you start to feel better.  Keep all follow-up visits as directed by your health care provider. This is important.  Talk with your health care provider about changing your diet. This may help your symptoms. Your health care provide may recommend changes, such  as:  Drinking more fluids.  Avoiding milk and other foods that contain lactose.  Eating a low-fat diet.  Avoiding high-fiber foods, such as popcorn and nuts.  Avoiding carbonated beverages, such as soda.  Eating smaller meals more often rather than eating large  meals.  Keeping a food diary to identify foods that make your symptoms better or worse.  Do not use any tobacco products, including cigarettes, chewing tobacco, or electronic cigarettes. If you need help quitting, ask your health care provider.  Limit alcohol intake to no more than 1 drink per day for nonpregnant women and 2 drinks per day for men. One drink equals 12 ounces of beer, 5 ounces of wine, or 1 ounces of hard liquor.  Exercise daily or as directed by your health care provider. SEEK MEDICAL CARE IF:  You have diarrhea, abdominal cramps, and other gastrointestinal problems that are present almost all of the time.  Your symptoms do not improve with treatment.  You continue to lose weight.  You develop a rash or sores on your skin.  You develop eye problems.  You have a fever.   Your symptoms get worse.  You develop new symptoms. SEEK IMMEDIATE MEDICAL CARE IF:  You have bloody diarrhea.  You develop severe abdominal pain.  You cannot pass stools.   This information is not intended to replace advice given to you by your health care provider. Make sure you discuss any questions you have with your health care provider.   Document Released: 03/23/2005 Document Revised: 07/04/2014 Document Reviewed: 01/29/2014 Elsevier Interactive Patient Education [employer].  I spoke with Dr. Gustavo Lah. He wants to you to take the prednisone 40 mg a day that's too of the pills I prescribed for you've. Do that every day until he can see you in a week to 10 days. Please call his office on Monday morning to make sure you have the appointment scheduled don't forget to tell them that you were in the hospital emergency room with a Crohn's exacerbation Please return at once for worse pain vomiting fever or feeling very sick or weak.

## 2015-06-12 NOTE — ED Notes (Signed)
Pt reports lower abd pain x 2 weeks, went to [facility] and was told that it was chron's disease, pt was prescribed meds but has not began them yet, pt denies n/v/d.

## 2015-12-04 ENCOUNTER — Encounter: Payer: Self-pay | Admitting: *Deleted

## 2015-12-07 ENCOUNTER — Encounter
Admission: RE | Disposition: A | Discharge status: Home / Self Care | Payer: Self-pay | Source: Ambulatory Visit | Attending: Gastroenterology

## 2015-12-07 ENCOUNTER — Encounter: Payer: Self-pay | Admitting: *Deleted

## 2015-12-07 ENCOUNTER — Ambulatory Visit: Payer: Medicare Other | Admitting: Anesthesiology

## 2015-12-07 ENCOUNTER — Ambulatory Visit
Admission: RE | Admit: 2015-12-07 | Discharge: 2015-12-07 | Disposition: A | Discharge status: Home / Self Care | Payer: Medicare Other | Source: Ambulatory Visit | Attending: Gastroenterology | Admitting: Gastroenterology

## 2015-12-07 DIAGNOSIS — Z79891 Long term (current) use of opiate analgesic: Secondary | ICD-10-CM | POA: Insufficient documentation

## 2015-12-07 DIAGNOSIS — D123 Benign neoplasm of transverse colon: Secondary | ICD-10-CM | POA: Insufficient documentation

## 2015-12-07 DIAGNOSIS — K621 Rectal polyp: Secondary | ICD-10-CM | POA: Insufficient documentation

## 2015-12-07 DIAGNOSIS — Z7982 Long term (current) use of aspirin: Secondary | ICD-10-CM | POA: Insufficient documentation

## 2015-12-07 DIAGNOSIS — K5730 Diverticulosis of large intestine without perforation or abscess without bleeding: Secondary | ICD-10-CM | POA: Insufficient documentation

## 2015-12-07 DIAGNOSIS — E785 Hyperlipidemia, unspecified: Secondary | ICD-10-CM | POA: Diagnosis not present

## 2015-12-07 DIAGNOSIS — K529 Noninfective gastroenteritis and colitis, unspecified: Secondary | ICD-10-CM | POA: Diagnosis not present

## 2015-12-07 DIAGNOSIS — K5080 Crohn's disease of both small and large intestine without complications: Secondary | ICD-10-CM | POA: Diagnosis not present

## 2015-12-07 DIAGNOSIS — N400 Enlarged prostate without lower urinary tract symptoms: Secondary | ICD-10-CM | POA: Insufficient documentation

## 2015-12-07 DIAGNOSIS — Z79899 Other long term (current) drug therapy: Secondary | ICD-10-CM | POA: Diagnosis not present

## 2015-12-07 DIAGNOSIS — K50919 Crohn's disease, unspecified, with unspecified complications: Secondary | ICD-10-CM

## 2015-12-07 HISTORY — DX: Histoplasmosis, unspecified: B39.9

## 2015-12-07 HISTORY — DX: Hyperlipidemia, unspecified: E78.5

## 2015-12-07 HISTORY — DX: Crohn's disease of large intestine without complications: K50.10

## 2015-12-07 HISTORY — DX: Headache: R51

## 2015-12-07 HISTORY — DX: Headache, unspecified: R51.9

## 2015-12-07 HISTORY — DX: Benign prostatic hyperplasia without lower urinary tract symptoms: N40.0

## 2015-12-07 HISTORY — DX: Insomnia, unspecified: G47.00

## 2015-12-07 HISTORY — DX: Lymphocytic colitis: K52.832

## 2015-12-07 HISTORY — PX: COLONOSCOPY WITH PROPOFOL: SHX5780

## 2015-12-07 SURGERY — COLONOSCOPY WITH PROPOFOL
Anesthesia: General

## 2015-12-07 MED ORDER — SODIUM CHLORIDE 0.9 % IV SOLN: INTRAVENOUS | Status: DC

## 2015-12-07 MED ORDER — FENTANYL CITRATE (PF) 100 MCG/2ML IJ SOLN
25.0000 ug | INTRAMUSCULAR | Status: DC | PRN
  Administered 2015-12-07: 50 ug via INTRAVENOUS

## 2015-12-07 MED ORDER — PROPOFOL 500 MG/50ML IV EMUL
INTRAVENOUS | Status: DC | PRN
  Administered 2015-12-07: 125 ug/kg/min via INTRAVENOUS

## 2015-12-07 MED ORDER — SODIUM CHLORIDE 0.9 % IV SOLN
INTRAVENOUS | Status: DC
  Administered 2015-12-07 (×2): via INTRAVENOUS

## 2015-12-07 MED ORDER — MIDAZOLAM HCL 2 MG/2ML IJ SOLN
INTRAMUSCULAR | Status: DC | PRN
  Administered 2015-12-07: 1 mg via INTRAVENOUS

## 2015-12-07 MED ORDER — PROPOFOL 10 MG/ML IV BOLUS
INTRAVENOUS | Status: DC | PRN
  Administered 2015-12-07: 20 mg via INTRAVENOUS
  Administered 2015-12-07: 50 mg via INTRAVENOUS

## 2015-12-07 MED ORDER — ONDANSETRON HCL 4 MG/2ML IJ SOLN: 4.0000 mg | Freq: Once | INTRAMUSCULAR | Status: DC | PRN

## 2015-12-07 NOTE — Anesthesia Preprocedure Evaluation (Signed)
Anesthesia Evaluation  Patient identified by MRN, date of birth, ID band Patient awake    Reviewed: Allergy & Precautions, H&P , NPO status , Patient's Chart, lab work & pertinent test results  History of Anesthesia Complications Negative for: history of anesthetic complications  Airway Mallampati: II  TM Distance: >3 FB Neck ROM: full    Dental  (+) Missing, Poor Dentition, Partial Lower, Partial Upper   Pulmonary neg pulmonary ROS, neg shortness of breath, COPD,    Pulmonary exam normal breath sounds clear to auscultation       Cardiovascular Exercise Tolerance: Good (-) Past MI negative cardio ROS Normal cardiovascular exam Rhythm:regular Rate:Normal     Neuro/Psych  Headaches, negative neurological ROS  negative psych ROS   GI/Hepatic negative GI ROS, Neg liver ROS, GERD  Controlled,crohns Dz diverticulosis   Endo/Other  negative endocrine ROS  Renal/GU negative Renal ROS  negative genitourinary   Musculoskeletal negative musculoskeletal ROS (+)   Abdominal   Peds negative pediatric ROS (+)  Hematology negative hematology ROS (+)   Anesthesia Other Findings Past Medical History:   Diverticulitis   HLD                      Intestinal mass   GERD                            Reproductive/Obstetrics negative OB ROS                             Anesthesia Physical  Anesthesia Plan  ASA: II  Anesthesia Plan: General   Post-op Pain Management:    Induction: Intravenous  Airway Management Planned: Nasal Cannula  Additional Equipment:   Intra-op Plan:   Post-operative Plan:   Informed Consent: I have reviewed the patients History and Physical, chart, labs and discussed the procedure including the risks, benefits and alternatives for the proposed anesthesia with the patient or authorized representative who has indicated his/her understanding and acceptance.   Dental  advisory given  Plan Discussed with: CRNA and Surgeon  Anesthesia Plan Comments:        Anesthesia Quick Evaluation

## 2015-12-07 NOTE — Anesthesia Procedure Notes (Signed)
Date/Time: 12/07/2015 7:59 AM Performed by: Nelda Marseille Pre-anesthesia Checklist: Patient identified, Emergency Drugs available, Suction available, Patient being monitored and Timeout performed Oxygen Delivery Method: Nasal cannula

## 2015-12-07 NOTE — H&P (Signed)
Outpatient short stay form Pre-procedure 12/07/2015 7:50 AM Lollie Sails MD  Primary Physician: Dr. [place] Pink  Reason for visit:  Colonoscopy  History of present illness:  Patient is a 75 year old male presenting today for colonoscopy. He has recent diagnosis of Crohn's disease. His had extensive evaluation along the finding of possible either the TI were the ascending colon. Only on Humira having his initial doses in February 2017. He states that his pain is resolved. He has normal bowel habits. He does take a 81 mg aspirin but he is held that for about a week now. He takes no other blood thinning agents or aspirin products. He is presenting today for reevaluation of the colon. He tolerated his prep well.    Current facility-administered medications:  .  0.9 %  sodium chloride infusion, , Intravenous, Continuous, Lollie Sails, MD, Last Rate: 20 mL/hr at 12/07/15 0721 .  0.9 %  sodium chloride infusion, , Intravenous, Continuous, Lollie Sails, MD .  fentaNYL (SUBLIMAZE) injection 25 mcg, 25 mcg, Intravenous, Q5 min PRN, Alvin Critchley, MD .  ondansetron [facility]) injection 4 mg, 4 mg, Intravenous, Once PRN, Alvin Critchley, MD  Prescriptions prior to admission  Medication Sig Dispense Refill Last Dose  . Adalimumab (HUMIRA PEN-CROHNS STARTER [place]) Inject into the skin.     Marland Kitchen aspirin 81 MG tablet Take 81 mg by mouth daily.   Past Week at Unknown time  . OMEGA FATTY ACIDS-PHYTOSTEROLS PO Take by mouth.   Past Week at Unknown time  . SUMAtriptan (IMITREX) 100 MG tablet Take 100 mg by mouth every 2 (two) hours as needed for migraine. May repeat in 2 hours if headache persists or recurs.   Past Week at Unknown time  . clorazepate (TRANXENE) 15 MG tablet Take 15 mg by mouth 2 (two) times daily.     Marland Kitchen geriatric multivitamins-minerals (ELDERTONIC/GEVRABON) ELIX Take 15 mLs by mouth daily.     . hydrocortisone (ANUSOL-HC) 2.5 % rectal cream Place 1 application rectally 2 (two) times daily.      Marland Kitchen oxyCODONE-acetaminophen (PERCOCET) 10-325 MG tablet Take 1 tablet by mouth every 6 (six) hours as needed for pain (This prescription is stronger than what you got in the hospital. Please take 1/2-1 pill every 6 hours as needed.). 20 tablet 0   . predniSONE (DELTASONE) 20 MG tablet Take 2 tablets (40 mg total) by mouth daily. (Patient not taking: Reported on 12/07/2015) 22 tablet 0 Not Taking at Unknown time  . simvastatin (ZOCOR) 40 MG tablet Take 40 mg by mouth daily.     . tadalafil (CIALIS) 20 MG tablet Take 20 mg by mouth daily as needed for erectile dysfunction.        No Known Allergies   Past Medical History  Diagnosis Date  . Diverticulitis   . BPH (benign prostatic hyperplasia)   . Crohn's colitis ([place]) 2016  . Focal lymphocytic colitis   . Headache   . Histoplasmosis   . Hyperlipemia   . Insomnia     Review of systems:      Physical Exam    Heart and lungs: Regular rate and rhythm without rub or gallop, lungs are bilaterally clear.    HEENT: Normocephalic atraumatic eyes are anicteric    Other:     Pertinant exam for procedure: Soft nontender nondistended bowel sounds positive normoactive.    Planned proceedures: Colonoscopy and indicated procedures. I have discussed the risks benefits and complications of procedures to include not limited to  bleeding, infection, perforation and the risk of sedation and the patient wishes to proceed.    Lollie Sails, MD Gastroenterology 12/07/2015  7:50 AM

## 2015-12-07 NOTE — Op Note (Addendum)
[facility] Gastroenterology Patient Name: Richard Craig Procedure Date: 12/07/2015 7:49 AM MRN: [id number] Account #: [id number] Date of Birth: 1940-07-07 Admit Type: Outpatient Age: 75 Room: [facility] ENDO ROOM 3 Gender: Male Note Status: Supervisor Override Procedure:            Colonoscopy Indications:          Follow-up of Crohn's disease of the small bowel and                        colon Providers:            Lollie Sails, MD Referring MD:         Irven Easterly. Kary Kos, MD (Referring MD) Medicines:            Monitored Anesthesia Care Complications:        No immediate complications. Procedure:            Pre-Anesthesia Assessment:                       - ASA Grade Assessment: II - A patient with mild                        systemic disease.                       After obtaining informed consent, the colonoscope was                        passed under direct vision. Throughout the procedure,                        the patient's blood pressure, pulse, and oxygen                        saturations were monitored continuously. The                        Colonoscope was introduced through the anus and                        advanced to the the cecum, identified by the ileocecal                        valve. The colonoscopy was performed without                        difficulty. The patient tolerated the procedure well.                        The quality of the bowel preparation was fair. Findings:      Multiple small to medium-mouthed diverticula were found in the sigmoid       colon and distal descending colon.      The scope was advanced to the ascending colon. There is the appearance       of a stenosis in the mid ascending, both by endoscopic appearance and       location through the abdomen, this is only about 5 mm in diameter and       was not passed. This area is likely scarred from the Crohns disease and       could  also represent a narrowed stenotic  valve. I did not press into       this stenosis when it would not easily open due to th variablility of       anatomy. There is minimal inflamation throughout the colon.      Biopsies for histology were taken with a cold forceps from the       ascending/possible neocecum colon, transverse colon, descending colon,       sigmoid colon and rectum for evaluation of microscopic colitis.      There were small polypoid structures removed from the sigmoid and rectum       and placed into separate jars.      The digital rectal exam was normal. Impression:           - Preparation of the colon was fair.                       - Diverticulosis in the sigmoid colon and in the distal                        descending colon.                       - Biopsies were taken with a cold forceps from the                        ascending colon, transverse colon, descending colon,                        sigmoid colon and rectum for evaluation of microscopic                        colitis. Recommendation:       - Discharge patient to home.                       - Await pathology results.                       - Return to GI clinic in 3 weeks. Procedure Code(s):    --- Professional ---                       [phone], Colonoscopy, flexible; with biopsy, single or                        multiple Diagnosis Code(s):    --- Professional ---                       K50.80, Crohn's disease of both small and large                        intestine without complications                       K57.30, Diverticulosis of large intestine without                        perforation or abscess without bleeding CPT copyright 2016 American Medical Association. All rights reserved. The codes documented in this report are preliminary and upon coder review may  be revised to meet current compliance requirements. Lollie Sails,  MD 12/07/2015 8:38:14 AM This report has been signed electronically. Number of Addenda: 0 Note Initiated On:  12/07/2015 7:49 AM Scope Withdrawal Time: 0 hours 17 minutes 29 seconds  Total Procedure Duration: 0 hours 29 minutes 40 seconds       [facility]

## 2015-12-07 NOTE — Transfer of Care (Signed)
Immediate Anesthesia Transfer of Care Note  Patient: Richard Craig  Procedure(s) Performed: Procedure(s): COLONOSCOPY WITH PROPOFOL (N/A)  Patient Location: PACU  Anesthesia Type:General  Level of Consciousness: sedated  Airway & Oxygen Therapy: Patient Spontanous Breathing and Patient connected to nasal cannula oxygen  Post-op Assessment: Report given to RN and Post -op Vital signs reviewed and stable  Post vital signs: Reviewed and stable  Last Vitals:  Filed Vitals:   12/07/15 0707  BP: 140/77  Pulse: 71  Temp: 36.3 C  Resp: 18    Last Pain: There were no vitals filed for this visit.       Complications: No apparent anesthesia complications

## 2015-12-08 LAB — SURGICAL PATHOLOGY

## 2015-12-09 ENCOUNTER — Encounter: Payer: Self-pay | Admitting: Gastroenterology

## 2015-12-09 NOTE — Anesthesia Postprocedure Evaluation (Signed)
Anesthesia Post Note  Patient: Richard Craig  Procedure(s) Performed: Procedure(s) (LRB): COLONOSCOPY WITH PROPOFOL (N/A)  Patient location during evaluation: PACU Anesthesia Type: General Level of consciousness: awake and alert and oriented Pain management: pain level controlled Vital Signs Assessment: post-procedure vital signs reviewed and stable Respiratory status: spontaneous breathing Cardiovascular status: blood pressure returned to baseline Anesthetic complications: no    Last Vitals:  Filed Vitals:   12/07/15 0850 12/07/15 0900  BP: 123/80 131/94  Pulse: 55 58  Temp:    Resp: 14 12    Last Pain:  Filed Vitals:   12/08/15 0739  PainSc: 0-No pain                 Ellerie Arenz

## 2015-12-14 ENCOUNTER — Other Ambulatory Visit: Payer: Self-pay | Admitting: Gastroenterology

## 2015-12-14 DIAGNOSIS — K50019 Crohn's disease of small intestine with unspecified complications: Secondary | ICD-10-CM

## 2015-12-15 ENCOUNTER — Ambulatory Visit
Admission: RE | Admit: 2015-12-15 | Discharge: 2015-12-15 | Disposition: A | Discharge status: Home / Self Care | Payer: Medicare Other | Source: Ambulatory Visit | Attending: Gastroenterology | Admitting: Gastroenterology

## 2015-12-15 DIAGNOSIS — K50919 Crohn's disease, unspecified, with unspecified complications: Secondary | ICD-10-CM | POA: Diagnosis not present

## 2015-12-15 DIAGNOSIS — I700 Atherosclerosis of aorta: Secondary | ICD-10-CM | POA: Insufficient documentation

## 2015-12-15 DIAGNOSIS — K50019 Crohn's disease of small intestine with unspecified complications: Secondary | ICD-10-CM

## 2015-12-15 DIAGNOSIS — K8020 Calculus of gallbladder without cholecystitis without obstruction: Secondary | ICD-10-CM | POA: Diagnosis not present

## 2015-12-15 DIAGNOSIS — J439 Emphysema, unspecified: Secondary | ICD-10-CM | POA: Diagnosis not present

## 2015-12-15 LAB — POCT I-STAT CREATININE: Creatinine, Ser: 1.1 mg/dL (ref 0.61–1.24)

## 2015-12-15 MED ORDER — IOPAMIDOL (ISOVUE-300) INJECTION 61%
100.0000 mL | Freq: Once | INTRAVENOUS | Status: AC | PRN
  Administered 2015-12-15: 100 mL via INTRAVENOUS

## 2016-09-19 IMAGING — CT CT ABD-PELV W/ CM
2 of 5 series · 15 of 46 positions shown, 17 images · IV contrast (omnipaque)
Comparison: None.

CLINICAL DATA: Increasing left lower quadrant abdominal pain for
the past month. History of diverticulitis.

EXAM:
CT ABDOMEN AND PELVIS WITH CONTRAST
TECHNIQUE: Multidetector CT imaging of the abdomen and pelvis was performed
using the standard protocol following bolus administration of
intravenous contrast.
CONTRAST:  100mL OMNIPAQUE IOHEXOL 300 MG/ML  SOLN

[Series 2: routine abd pel with · axial · 0.67mm/px · z∈[-962,-557]mm · 12 of 93 slices shown, 14 images]
[im 6/93  soft-tissue]
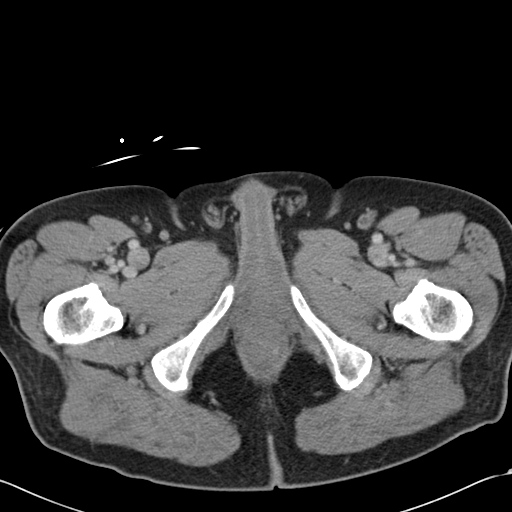
[im 6/93  bone]
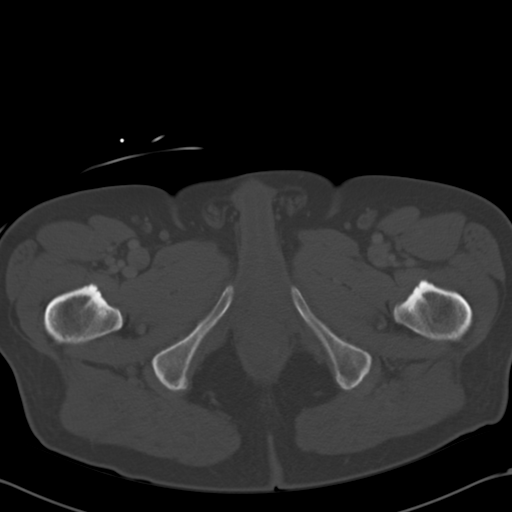
[im 17/93  soft-tissue]
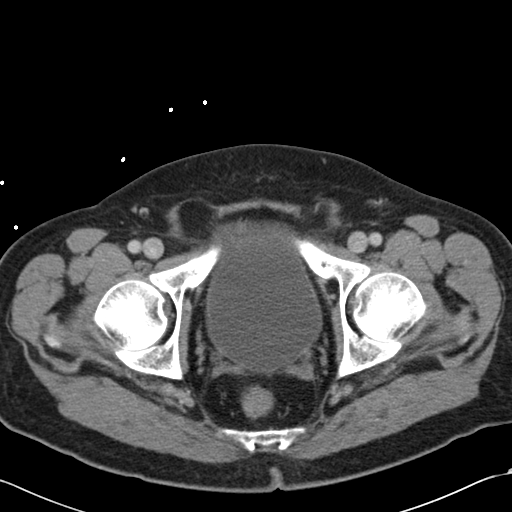
[im 22/93  soft-tissue]
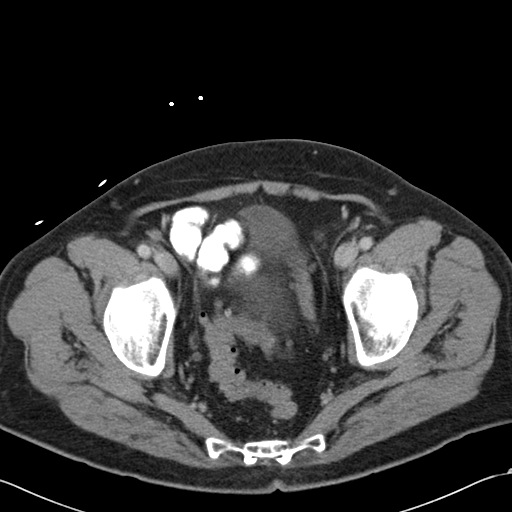
[im 28/93  soft-tissue]
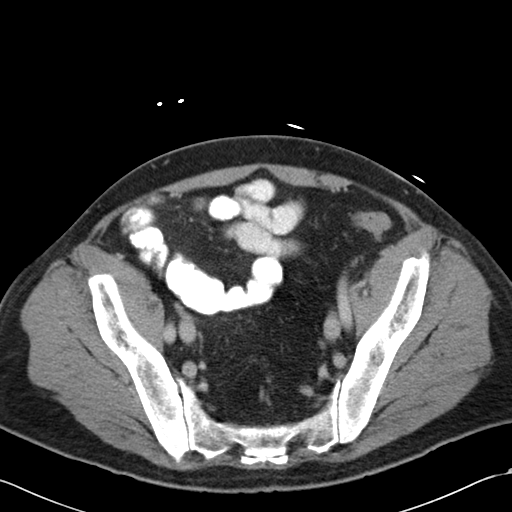
[im 38/93  soft-tissue]
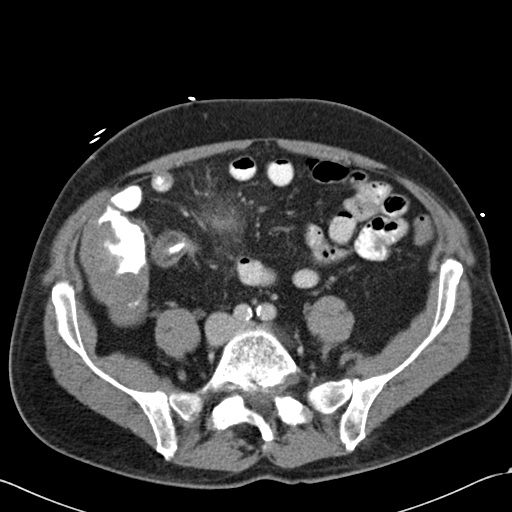
[im 44/93  soft-tissue]
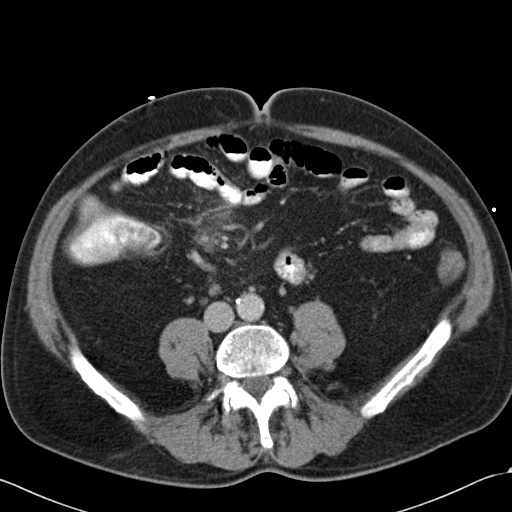
[im 49/93  soft-tissue]
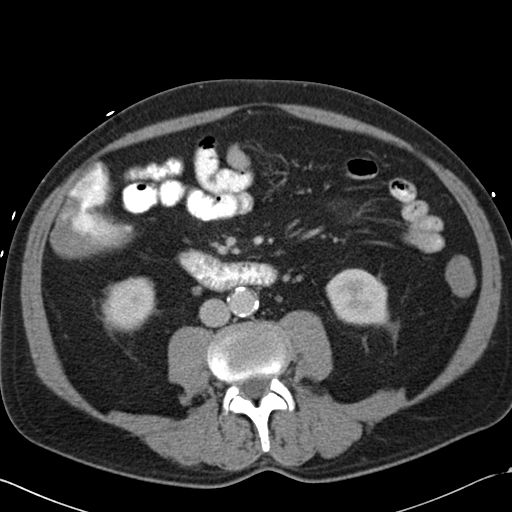
[im 60/93  soft-tissue]
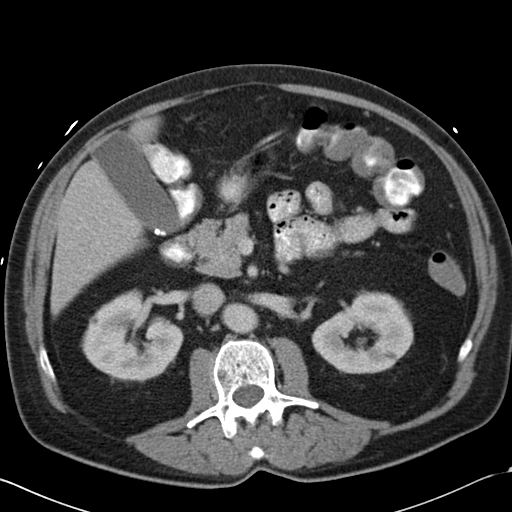
[im 65/93  soft-tissue]
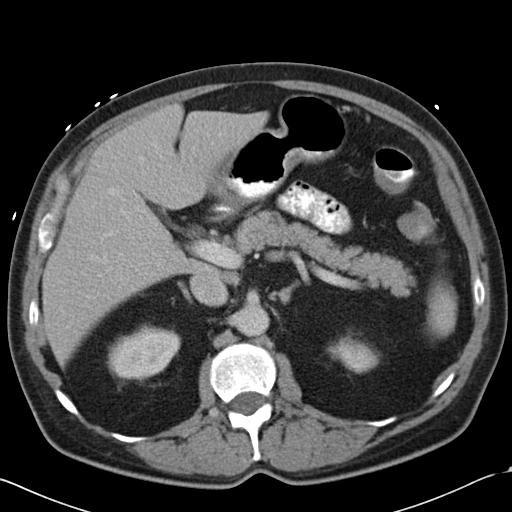
[im 65/93  bone]
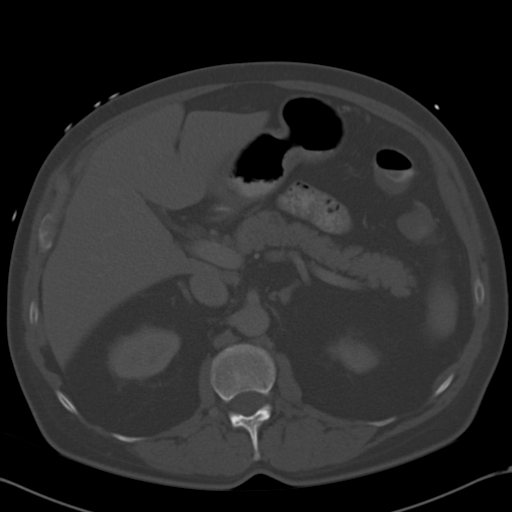
[im 71/93  soft-tissue]
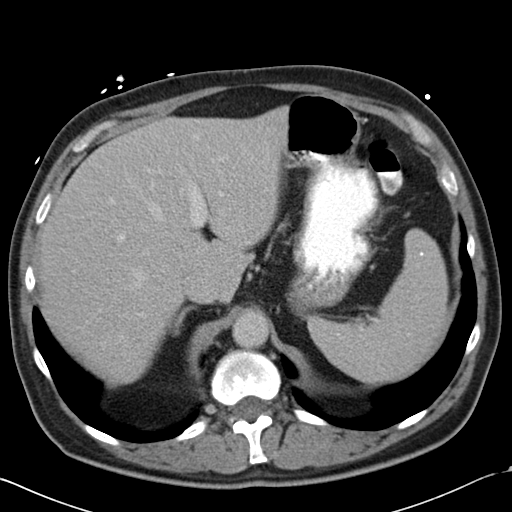
[im 82/93  soft-tissue]
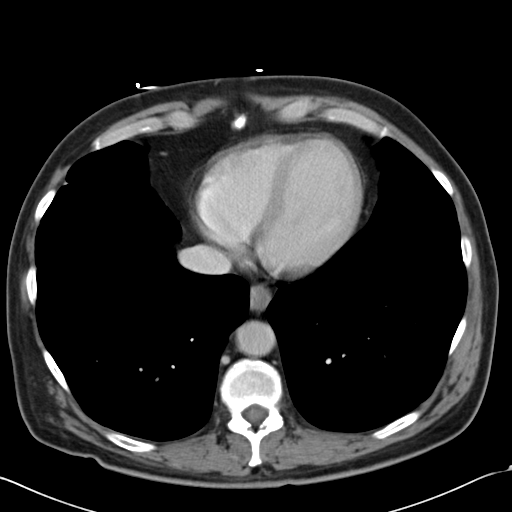
[im 87/93  soft-tissue]
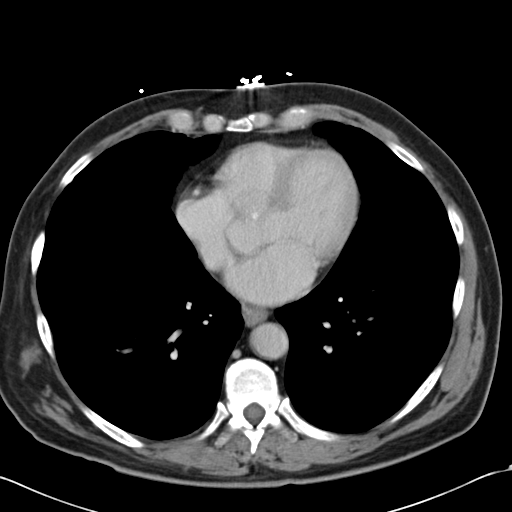

[Series 5: cor routine abd pel with · coronal · 0.66mm/px · 3 of 136 slices shown]
[im 46/136  soft-tissue]
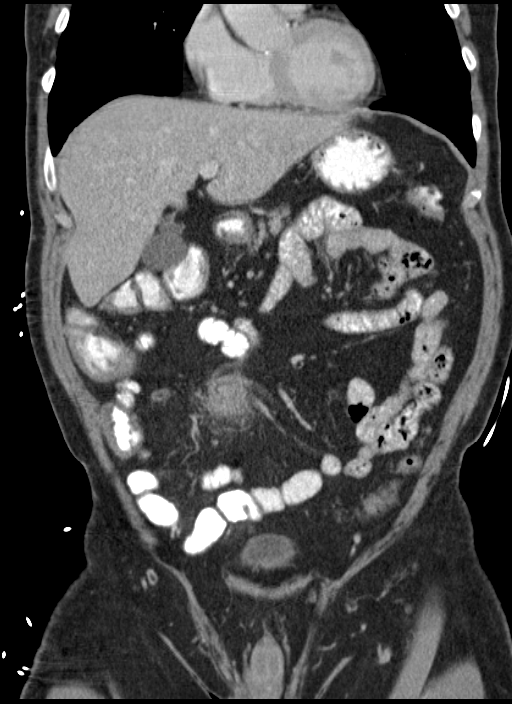
[im 61/136  soft-tissue]
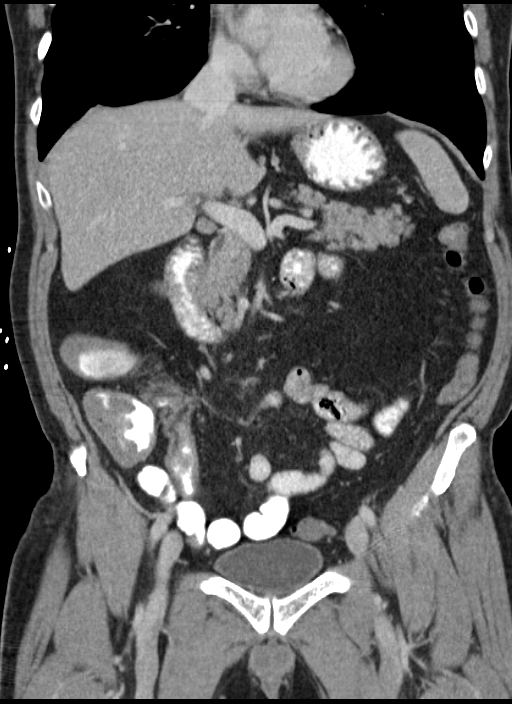
[im 76/136  soft-tissue]
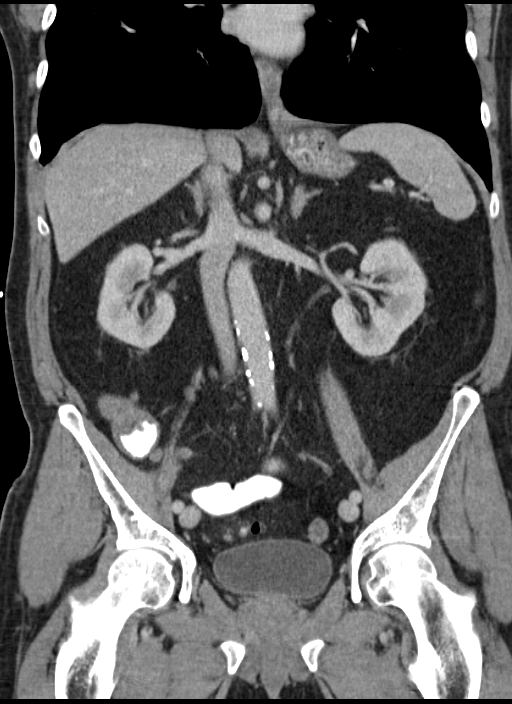

[15 of 46 positions shown; findings below may reference images not displayed]

FINDINGS: Small calcified granulomata in the spleen. Small gallstones in the
gallbladder, measuring up to 3 mm in diameter each. No gallbladder
wall thickening or pericholecystic fluid.

Irregular mass-like area of soft tissue density in the right lower
quadrant abdominal mesenteric knee with ill-defined increased
density in the surrounding fat. This area measures 4.0 x 3.8 cm on
image number 53. On coronal image number 50, this measures 3.4 cm in
cephalocaudal dimension. There is associated distortion of the
adjacent tissues including the adjacent terminal ileum with acute
angulation between the terminal ileum and right colon with medium
density and fat density wall thickening, luminal narrowing and a
probable small fistula between the terminal ileum and right colon.
There is also eccentric wall thickening involving the cecum and
inferior right colon.

No enlarged lymph nodes. Small bilateral renal cysts. 5 mm
semi-solid nodule in the right lower lobe on image number 7. Two
tiny subpleural nodules in the left lower lobe on image 8. The lungs
are hyperexpanded at the lung bases with bullous changes.

Small liver cysts. Unremarkable the pancreas adrenal glands, ureters
and urinary bladder. Mildly enlarged prostate gland. Right inguinal
hernia containing fat. Multiple sigmoid colon diverticula without
evidence of diverticulitis. Lumbar and lower thoracic spine
degenerative changes.
IMPRESSION: 1. 4.0 x 3.8 x 3.4 cm irregular mass like area of soft tissue
density, infiltration of the surrounding fat, angulation and median
and fat density wall thickening of the adjacent terminal ileum and
right colon and probable small fistula between the terminal ileum
and right colon. This combination of findings is most compatible
with an inflammatory process such as Crohn's disease. A neoplastic
process, such as a carcinoid tumor, is less likely.
2. 5 mm right lower lobe nodule and 2 tiny subpleural nodules in the
left lower lobe. If the patient is at high risk for bronchogenic
carcinoma, follow-up chest CT at 6-12 months is recommended. If the
patient is at low risk for bronchogenic carcinoma, follow-up chest
CT at 12 months is recommended. This recommendation follows the
consensus statement: Guidelines for Management of Small Pulmonary
Nodules Detected on CT Scans: A Statement from the Bal
3. Changes of COPD.
4. Sigmoid diverticulosis without evidence of diverticulitis.
5. Cholelithiasis.

## 2017-01-01 IMAGING — CR DG ABDOMEN ACUTE W/ 1V CHEST
3 series · 3 of 3 positions shown · non-contrast
Comparison: 03/20/2015

CLINICAL DATA: Periumbilical pain, history of Crohn's disease

EXAM:
DG ABDOMEN ACUTE W/ 1V CHEST

[chest pa]
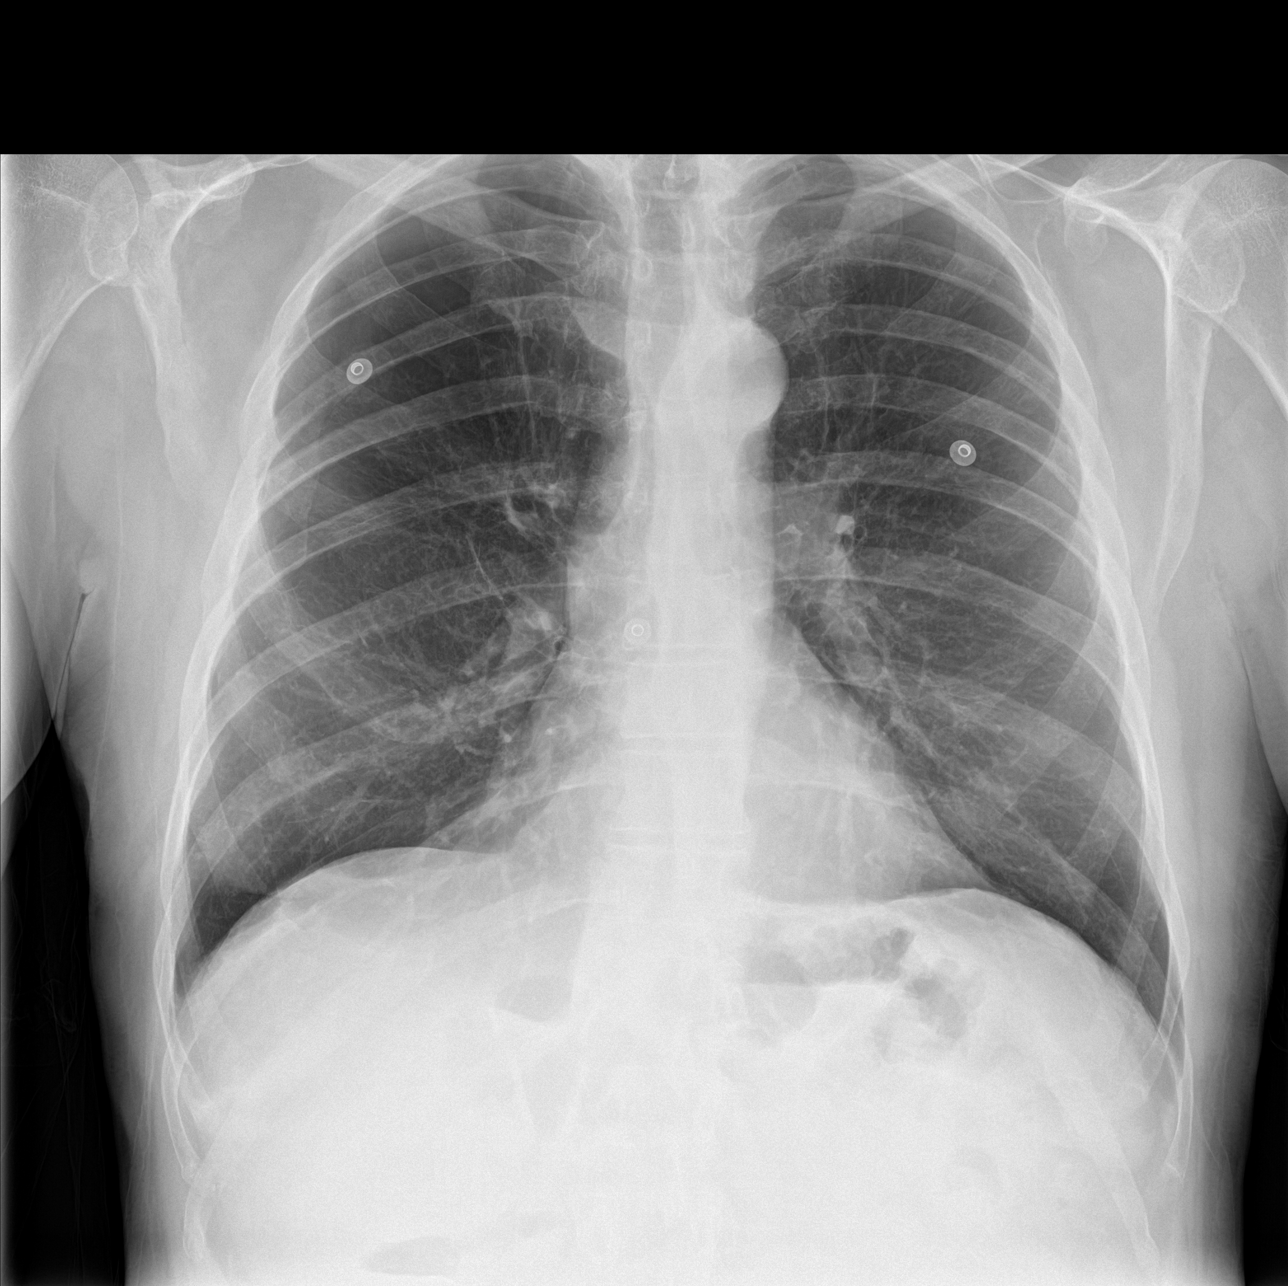

[abdomen erect]
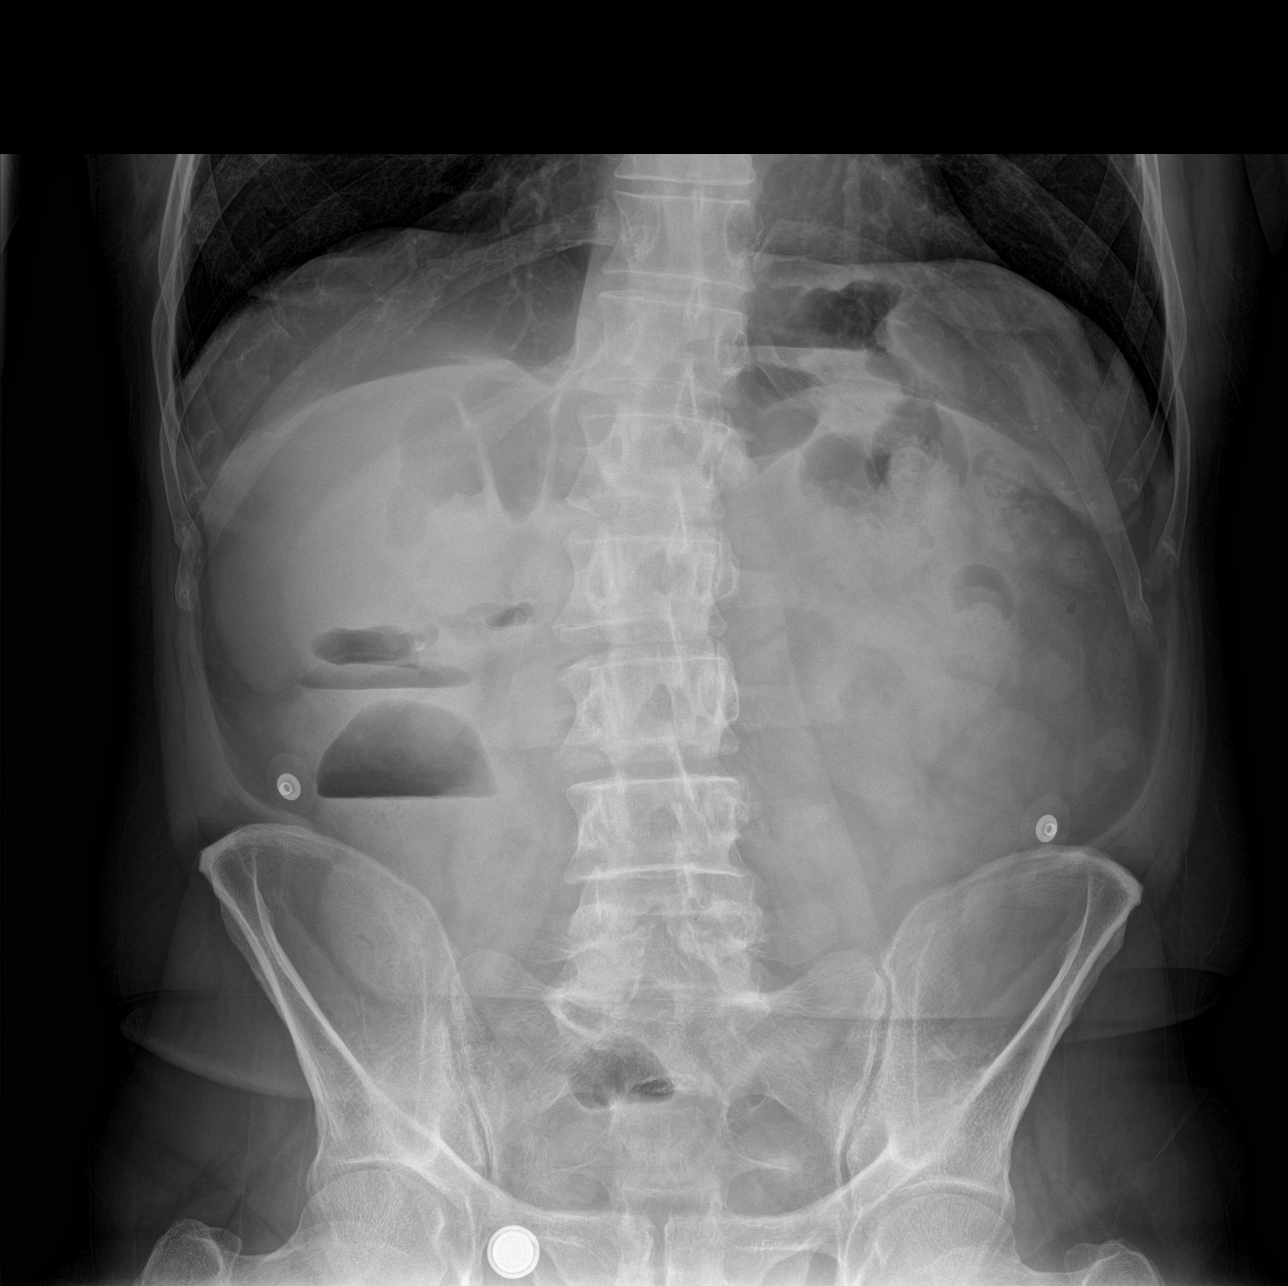

[abdomen supine]
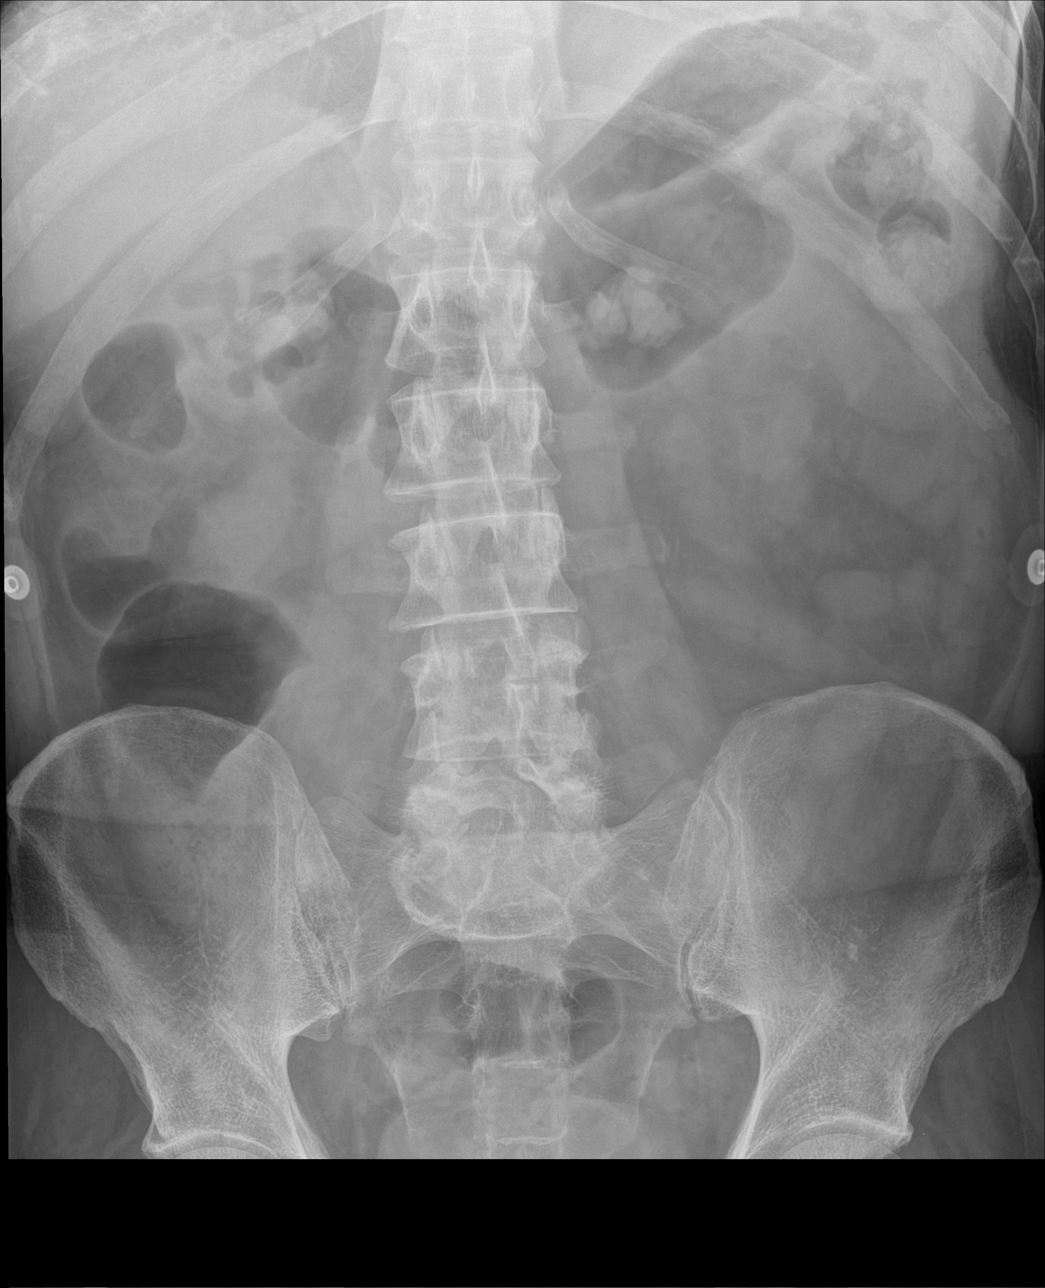

[3 of 3 positions shown; findings below may reference images not displayed]

FINDINGS: Cardiac shadow is within normal limits. The lungs are clear
bilaterally.

Scattered large and small bowel gas is noted. No obstructive changes
are seen. A few nonspecific air-fluid levels are noted within the
right abdomen. No free air is noted. No abnormal mass or abnormal
calcifications are seen. No bony abnormality is seen.
IMPRESSION: Scattered air-fluid levels.  No obstructive changes are seen.

## 2019-03-08 ENCOUNTER — Other Ambulatory Visit: Payer: Self-pay

## 2019-03-08 ENCOUNTER — Other Ambulatory Visit
Admission: RE | Admit: 2019-03-08 | Discharge: 2019-03-08 | Disposition: A | Discharge status: Home / Self Care | Payer: Medicare Other | Source: Ambulatory Visit | Attending: Gastroenterology | Admitting: Gastroenterology

## 2019-03-08 DIAGNOSIS — Z20828 Contact with and (suspected) exposure to other viral communicable diseases: Secondary | ICD-10-CM | POA: Insufficient documentation

## 2019-03-08 DIAGNOSIS — Z01812 Encounter for preprocedural laboratory examination: Secondary | ICD-10-CM | POA: Diagnosis present

## 2019-03-09 LAB — SARS CORONAVIRUS 2 (TAT 6-24 HRS): SARS Coronavirus 2: NEGATIVE

## 2019-03-11 ENCOUNTER — Encounter: Payer: Self-pay | Admitting: *Deleted

## 2019-03-12 ENCOUNTER — Encounter: Payer: Self-pay | Admitting: *Deleted

## 2019-03-12 ENCOUNTER — Ambulatory Visit: Payer: Medicare Other | Admitting: Anesthesiology

## 2019-03-12 ENCOUNTER — Encounter
Admission: RE | Disposition: A | Discharge status: Home / Self Care | Payer: Self-pay | Source: Home / Self Care | Attending: Gastroenterology

## 2019-03-12 ENCOUNTER — Ambulatory Visit
Admission: RE | Admit: 2019-03-12 | Discharge: 2019-03-12 | Disposition: A | Discharge status: Home / Self Care | Payer: Medicare Other | Attending: Gastroenterology | Admitting: Gastroenterology

## 2019-03-12 ENCOUNTER — Other Ambulatory Visit: Payer: Self-pay

## 2019-03-12 DIAGNOSIS — D125 Benign neoplasm of sigmoid colon: Secondary | ICD-10-CM | POA: Insufficient documentation

## 2019-03-12 DIAGNOSIS — K621 Rectal polyp: Secondary | ICD-10-CM | POA: Insufficient documentation

## 2019-03-12 DIAGNOSIS — D124 Benign neoplasm of descending colon: Secondary | ICD-10-CM | POA: Diagnosis not present

## 2019-03-12 DIAGNOSIS — E785 Hyperlipidemia, unspecified: Secondary | ICD-10-CM | POA: Insufficient documentation

## 2019-03-12 DIAGNOSIS — K5730 Diverticulosis of large intestine without perforation or abscess without bleeding: Secondary | ICD-10-CM | POA: Insufficient documentation

## 2019-03-12 DIAGNOSIS — N529 Male erectile dysfunction, unspecified: Secondary | ICD-10-CM | POA: Diagnosis not present

## 2019-03-12 DIAGNOSIS — Z79899 Other long term (current) drug therapy: Secondary | ICD-10-CM | POA: Insufficient documentation

## 2019-03-12 DIAGNOSIS — Z7982 Long term (current) use of aspirin: Secondary | ICD-10-CM | POA: Insufficient documentation

## 2019-03-12 DIAGNOSIS — K5090 Crohn's disease, unspecified, without complications: Secondary | ICD-10-CM | POA: Insufficient documentation

## 2019-03-12 DIAGNOSIS — K624 Stenosis of anus and rectum: Secondary | ICD-10-CM | POA: Insufficient documentation

## 2019-03-12 HISTORY — PX: COLONOSCOPY WITH PROPOFOL: SHX5780

## 2019-03-12 SURGERY — COLONOSCOPY WITH PROPOFOL
Anesthesia: General

## 2019-03-12 MED ORDER — GLYCOPYRROLATE 0.2 MG/ML IJ SOLN
INTRAMUSCULAR | Status: DC | PRN
  Administered 2019-03-12: 0.2 mg via INTRAVENOUS

## 2019-03-12 MED ORDER — PROPOFOL 10 MG/ML IV BOLUS
INTRAVENOUS | Status: DC | PRN
  Administered 2019-03-12: 20 mg via INTRAVENOUS
  Administered 2019-03-12: 80 mg via INTRAVENOUS

## 2019-03-12 MED ORDER — PROPOFOL 500 MG/50ML IV EMUL
INTRAVENOUS | Status: DC | PRN
  Administered 2019-03-12: 120 ug/kg/min via INTRAVENOUS

## 2019-03-12 MED ORDER — SODIUM CHLORIDE 0.9 % IV SOLN: INTRAVENOUS | Status: DC

## 2019-03-12 MED ORDER — SODIUM CHLORIDE 0.9 % IV SOLN
INTRAVENOUS | Status: DC
  Administered 2019-03-12: 08:00:00 via INTRAVENOUS

## 2019-03-12 MED ORDER — PROPOFOL 10 MG/ML IV BOLUS
INTRAVENOUS | Status: AC
  Filled 2019-03-12: qty 100

## 2019-03-12 MED ORDER — PHENYLEPHRINE HCL (PRESSORS) 10 MG/ML IV SOLN
INTRAVENOUS | Status: AC
  Filled 2019-03-12: qty 1

## 2019-03-12 NOTE — H&P (Signed)
Outpatient short stay form Pre-procedure 03/12/2019 7:43 AM Lollie Sails MD  Primary Physician: Dr. Maryland Pink  Reason for visit: Colonoscopy  History of present illness: Patient is a 78 year old male presenting today for colonoscopy in regards to his personal history of Crohn's disease.  He is on Humira therapy since late 2016.  He is overall doing quite well.  He has no diarrhea abdominal pain or rectal bleeding.  His last colonoscopy was 12/07/2015 with mild inflammation and there is a stenotic area in the mid a sending colon.  He tolerated his prep well.  He takes no aspirin or blood thinning agent.  He had quit his 81 mg aspirin sometime ago.    Current Facility-Administered Medications:  .  0.9 %  sodium chloride infusion, , Intravenous, Continuous, Lollie Sails, MD .  0.9 %  sodium chloride infusion, , Intravenous, Continuous, Lollie Sails, MD  Medications Prior to Admission  Medication Sig Dispense Refill Last Dose  . Adalimumab (HUMIRA PEN-CROHNS STARTER [place]) Inject into the skin.   Past Week at Unknown time  . aspirin 81 MG tablet Take 81 mg by mouth daily.   Past Week at Unknown time  . clorazepate (TRANXENE) 15 MG tablet Take 15 mg by mouth 2 (two) times daily.   03/11/2019 at Unknown time  . geriatric multivitamins-minerals (ELDERTONIC/GEVRABON) ELIX Take 15 mLs by mouth daily.   Past Week at Unknown time  . OMEGA FATTY ACIDS-PHYTOSTEROLS PO Take by mouth.   Past Week at Unknown time  . simvastatin (ZOCOR) 40 MG tablet Take 40 mg by mouth daily.   03/11/2019 at Unknown time  . tadalafil (CIALIS) 20 MG tablet Take 20 mg by mouth daily as needed for erectile dysfunction.   Past Week at Unknown time  . hydrocortisone (ANUSOL-HC) 2.5 % rectal cream Place 1 application rectally 2 (two) times daily.   Completed Course at Unknown time  . SUMAtriptan (IMITREX) 100 MG tablet Take 100 mg by mouth every 2 (two) hours as needed for migraine. May repeat in 2 hours if headache  persists or recurs.    at prn     No Known Allergies   Past Medical History:  Diagnosis Date  . BPH (benign prostatic hyperplasia)   . Crohn's colitis ([place]) 2016  . Diverticulitis   . Focal lymphocytic colitis   . Headache   . Histoplasmosis   . Hyperlipemia   . Insomnia     Review of systems:      Physical Exam    Heart and lungs: Regular rate and rhythm without rub or gallop lungs are bilaterally clear    HEENT: Normocephalic atraumatic eyes are anicteric    Other:    Pertinant exam for procedure: Soft nontender nondistended bowel sounds positive normoactive    Planned proceedures: Colonoscopy and indicated procedures. I have discussed the risks benefits and complications of procedures to include not limited to bleeding, infection, perforation and the risk of sedation and the patient wishes to proceed.    Lollie Sails, MD Gastroenterology 03/12/2019  7:43 AM

## 2019-03-12 NOTE — Op Note (Addendum)
[facility]astroenter[facility] Patient Name: Richard Craig Procedure Date: 03/12/2019 7:46 AM MRN: [id number] Account #: [id number] Date of Birth: 05/08/1941 Admit Type: Outpatient Age: 78 Room: [facility] ENDO ROOM 3 Gender: Male Note Status: Finalized Procedure:            Colonoscopy Indications:          Personal history of Crohn's disease Providers:            Lollie Sails, MD Referring MD:         Irven Easterly. Kary Kos, MD (Referring MD) Medicines:            Monitored Anesthesia Care Complications:        No immediate complications. Procedure:            Pre-Anesthesia Assessment:                       - ASA Grade Assessment: II - A patient with mild                        systemic disease.                       After obtaining informed consent, the colonoscope was                        passed under direct vision. Throughout the procedure,                        the patient's blood pressure, pulse, and oxygen                        saturations were monitored continuously. The                        Colonoscope was introduced through the anus and                        advanced to the the cecum, identified by appendiceal                        orifice and ileocecal valve. The colonoscopy was                        performed without difficulty. The patient tolerated the                        procedure well. The quality of the bowel preparation                        was good. Findings:      Multiple small and large-mouthed diverticula were found in the sigmoid       colon and distal descending colon.      A 7 mm polyp was found in the proximal descending colon. The polyp was       sessile. The polyp was removed with a cold snare. Resection and       retrieval were complete.      A 2 mm polyp was found in the proximal descending colon. The polyp was       sessile. The polyp was removed with a cold biopsy forceps. Resection and  retrieval were complete.   The exam was otherwise normal throughout the examined colon.      A benign-appearing, intrinsic moderate stenosis measuring 2 cm (in       length) x 9 mm (inner diameter) was found at the level of the ileocecal       valve, encompassing the colonic circumlinear fold on which the valve is       located and was traversed. The mucosa is normal and smooth in       appearance. This gives the cecum the appearance of a small segemntation.       I was unable to intubate the IC valve itself due to the anculation of       the stenosis, though the edge could be appreciated.      Biopsies for histology were taken with a cold forceps from the cecum,       ascending colon, right transverse colon, left transverse colon,       descending colon, sigmoid colon and rectum for evaluation of microscopic       colitis.      The retroflexed view of the distal rectum and anal verge was normal and       showed no anal or rectal abnormalities.      A benign-appearing, intrinsic mild stenosis measuring less than one cm       (in length) x 9 mm (inner diameter) was found at the anus and was       traversed. Impression:           - Diverticulosis in the sigmoid colon and in the distal                        descending colon.                       - One 7 mm polyp in the proximal descending colon,                        removed with a cold snare. Resected and retrieved.                       - One 2 mm polyp in the proximal descending colon,                        removed with a cold biopsy forceps. Resected and                        retrieved.                       - Stricture at the ileocecal valve.                       - The distal rectum and anal verge are normal on                        retroflexion view.                       - Stricture at the anus.                       - Biopsies were taken with a cold forceps from the  cecum, ascending colon, right transverse colon, left                         transverse colon, descending colon, sigmoid colon and                        rectum for evaluation of microscopic colitis. Recommendation:       - Await pathology results.                       - Continue present medications.                       - Perform a small bowel follow through at appointment                        to be scheduled. Procedure Code(s):    --- Professional ---                       [phone], Colonoscopy, flexible; with removal of tumor(s),                        polyp(s), or other lesion(s) by snare technique                       45380, 29, Colonoscopy, flexible; with biopsy, single                        or multiple Diagnosis Code(s):    --- Professional ---                       K63.5, Polyp of colon                       K56.699, Other intestinal obstruction unspecified as to                        partial versus complete obstruction                       K62.4, Stenosis of anus and rectum                       Z87.19, Personal history of other diseases of the                        digestive system                       K57.30, Diverticulosis of large intestine without                        perforation or abscess without bleeding CPT copyright 2019 American Medical Association. All rights reserved. The codes documented in this report are preliminary and upon coder review may  be revised to meet current compliance requirements. Lollie Sails, MD 03/12/2019 8:43:28 AM This report has been signed electronically. Number of Addenda: 0 Note Initiated On: 03/12/2019 7:46 AM Scope Withdrawal Time: 0 hours 24 minutes 15 seconds  Total Procedure Duration: 0 hours 41 minutes 6 seconds       [facility]

## 2019-03-12 NOTE — Anesthesia Postprocedure Evaluation (Signed)
Anesthesia Post Note  Patient: Passenger transport manager) Performed: COLONOSCOPY WITH PROPOFOL (N/A )  Patient location during evaluation: PACU Anesthesia Type: General Level of consciousness: awake and alert Pain management: pain level controlled Vital Signs Assessment: post-procedure vital signs reviewed and stable Respiratory status: spontaneous breathing, nonlabored ventilation, respiratory function stable and patient connected to nasal cannula oxygen Cardiovascular status: blood pressure returned to baseline and stable Postop Assessment: no apparent nausea or vomiting Anesthetic complications: no     Last Vitals:  Vitals:   03/12/19 0840 03/12/19 0850  BP: 126/82 (!) 125/91  Pulse: 77 76  Resp: 19 15  Temp:    SpO2: 95% 95%    Last Pain:  Vitals:   03/12/19 0850  TempSrc:   PainSc: 0-No pain                 Molli Barrows

## 2019-03-12 NOTE — Anesthesia Preprocedure Evaluation (Signed)
Anesthesia Evaluation  Patient identified by MRN, date of birth, ID band Patient awake    Reviewed: Allergy & Precautions, H&P , NPO status , Patient's Chart, lab work & pertinent test results, reviewed documented beta blocker date and time   Airway Mallampati: II   Neck ROM: full    Dental  (+) Poor Dentition   Pulmonary neg pulmonary ROS,    Pulmonary exam normal        Cardiovascular negative cardio ROS Normal cardiovascular exam Rhythm:regular Rate:Normal     Neuro/Psych  Headaches, negative psych ROS   GI/Hepatic negative GI ROS, Neg liver ROS,   Endo/Other  negative endocrine ROS  Renal/GU negative Renal ROS  negative genitourinary   Musculoskeletal   Abdominal   Peds  Hematology negative hematology ROS (+)   Anesthesia Other Findings Past Medical History: No date: BPH (benign prostatic hyperplasia) 2016: Crohn's colitis ([place]) No date: Diverticulitis 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 No date: Headache 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 No date: Hyperlipemia No date: Insomnia Past Surgical History: 03/06/2015: COLONOSCOPY WITH PROPOFOL; N/A     Comment:  Procedure: COLONOSCOPY WITH PROPOFOL;  Surgeon: Lollie Sails, MD;  Location: [facility] ENDOSCOPY;  Service:               Endoscopy;  Laterality: N/A; 12/07/2015: COLONOSCOPY WITH PROPOFOL; N/A     Comment:  Procedure: COLONOSCOPY WITH PROPOFOL;  Surgeon: Lollie Sails, MD;  Location: [facility] ENDOSCOPY;  Service:               Endoscopy;  Laterality: N/A; No date: ESOPHAGOGASTRODUODENOSCOPY No date: hystoplasmosis   Reproductive/Obstetrics negative OB ROS                             Anesthesia Physical Anesthesia Plan  ASA: II  Anesthesia Plan: General   Post-op Pain Management:    Induction:   PONV Risk Score and Plan:   Airway Management Planned:   Additional Equipment:   Intra-op Plan:    Post-operative Plan:   Informed Consent: I have reviewed the patients History and Physical, chart, labs and discussed the procedure including the risks, benefits and alternatives for the proposed anesthesia with the patient or authorized representative who has indicated his/her understanding and acceptance.     Dental Advisory Given  Plan Discussed with: CRNA  Anesthesia Plan Comments:         Anesthesia Quick Evaluation

## 2019-03-12 NOTE — Transfer of Care (Signed)
Immediate Anesthesia Transfer of Care Note  Patient: Richard Craig  Procedure(s) Performed: COLONOSCOPY WITH PROPOFOL (N/A )  Patient Location: PACU and Endoscopy Unit  Anesthesia Type:General  Level of Consciousness: awake, oriented, drowsy and patient cooperative  Airway & Oxygen Therapy: Patient Spontanous Breathing and Patient connected to nasal cannula oxygen  Post-op Assessment: Report given to RN, Post -op Vital signs reviewed and stable and Patient moving all extremities  Post vital signs: Reviewed and stable  Last Vitals:  Vitals Value Taken Time  BP 126/82 03/12/19 0840  Temp    Pulse 77 03/12/19 0841  Resp 23 03/12/19 0841  SpO2 93 % 03/12/19 0841  Vitals shown include unvalidated device data.  Last Pain:  Vitals:   03/12/19 0840  TempSrc:   PainSc: 0-No pain         Complications: No apparent anesthesia complications

## 2019-03-12 NOTE — Anesthesia Post-op Follow-up Note (Signed)
Anesthesia QCDR form completed.        

## 2019-03-13 ENCOUNTER — Encounter: Payer: Self-pay | Admitting: Gastroenterology

## 2019-03-14 LAB — SURGICAL PATHOLOGY

## 2019-08-09 ENCOUNTER — Ambulatory Visit: Payer: Medicare Other

## 2019-09-10 ENCOUNTER — Other Ambulatory Visit: Payer: Self-pay

## 2019-09-10 ENCOUNTER — Encounter: Payer: Self-pay | Admitting: Ophthalmology

## 2019-09-13 ENCOUNTER — Other Ambulatory Visit
Admission: RE | Admit: 2019-09-13 | Discharge: 2019-09-13 | Disposition: A | Discharge status: Home / Self Care | Payer: Medicare Other | Source: Ambulatory Visit | Attending: Ophthalmology | Admitting: Ophthalmology

## 2019-09-13 ENCOUNTER — Other Ambulatory Visit: Payer: Self-pay

## 2019-09-13 DIAGNOSIS — Z20822 Contact with and (suspected) exposure to covid-19: Secondary | ICD-10-CM | POA: Insufficient documentation

## 2019-09-13 DIAGNOSIS — Z01812 Encounter for preprocedural laboratory examination: Secondary | ICD-10-CM | POA: Insufficient documentation

## 2019-09-14 LAB — SARS CORONAVIRUS 2 (TAT 6-24 HRS): SARS Coronavirus 2: NEGATIVE

## 2019-09-16 NOTE — Discharge Instructions (Signed)

## 2019-09-17 ENCOUNTER — Ambulatory Visit: Payer: Medicare Other | Admitting: Anesthesiology

## 2019-09-17 ENCOUNTER — Encounter: Payer: Self-pay | Admitting: Ophthalmology

## 2019-09-17 ENCOUNTER — Ambulatory Visit
Admission: RE | Admit: 2019-09-17 | Discharge: 2019-09-17 | Disposition: A | Discharge status: Home / Self Care | Payer: Medicare Other | Attending: Ophthalmology | Admitting: Ophthalmology

## 2019-09-17 ENCOUNTER — Other Ambulatory Visit: Payer: Self-pay

## 2019-09-17 ENCOUNTER — Encounter
Admission: RE | Disposition: A | Discharge status: Home / Self Care | Payer: Self-pay | Source: Home / Self Care | Attending: Ophthalmology

## 2019-09-17 DIAGNOSIS — Z79899 Other long term (current) drug therapy: Secondary | ICD-10-CM | POA: Diagnosis not present

## 2019-09-17 DIAGNOSIS — H2512 Age-related nuclear cataract, left eye: Secondary | ICD-10-CM | POA: Diagnosis present

## 2019-09-17 DIAGNOSIS — K5090 Crohn's disease, unspecified, without complications: Secondary | ICD-10-CM | POA: Diagnosis not present

## 2019-09-17 HISTORY — PX: CATARACT EXTRACTION W/PHACO: SHX586

## 2019-09-17 HISTORY — DX: Presence of dental prosthetic device (complete) (partial): Z97.2

## 2019-09-17 HISTORY — DX: Gastro-esophageal reflux disease without esophagitis: K21.9

## 2019-09-17 SURGERY — PHACOEMULSIFICATION, CATARACT, WITH IOL INSERTION
Anesthesia: Monitor Anesthesia Care | Site: Eye | Laterality: Left

## 2019-09-17 MED ORDER — ARMC OPHTHALMIC DILATING DROPS
1.0000 "application " | OPHTHALMIC | Status: DC | PRN
  Administered 2019-09-17 (×3): 1 via OPHTHALMIC

## 2019-09-17 MED ORDER — MIDAZOLAM HCL 2 MG/2ML IJ SOLN
INTRAMUSCULAR | Status: DC | PRN
  Administered 2019-09-17: 2 mg via INTRAVENOUS

## 2019-09-17 MED ORDER — EPINEPHRINE PF 1 MG/ML IJ SOLN
INTRAOCULAR | Status: DC | PRN
  Administered 2019-09-17: 44 mL via OPHTHALMIC

## 2019-09-17 MED ORDER — BRIMONIDINE TARTRATE-TIMOLOL 0.2-0.5 % OP SOLN
OPHTHALMIC | Status: DC | PRN
  Administered 2019-09-17: 1 [drp] via OPHTHALMIC

## 2019-09-17 MED ORDER — TETRACAINE HCL 0.5 % OP SOLN
1.0000 [drp] | OPHTHALMIC | Status: DC | PRN
  Administered 2019-09-17 (×3): 1 [drp] via OPHTHALMIC

## 2019-09-17 MED ORDER — NA CHONDROIT SULF-NA HYALURON 40-17 MG/ML IO SOLN
INTRAOCULAR | Status: DC | PRN
  Administered 2019-09-17: 1 mL via INTRAOCULAR

## 2019-09-17 MED ORDER — MOXIFLOXACIN HCL 0.5 % OP SOLN
OPHTHALMIC | Status: DC | PRN
  Administered 2019-09-17: 0.2 mL via OPHTHALMIC

## 2019-09-17 MED ORDER — FENTANYL CITRATE (PF) 100 MCG/2ML IJ SOLN
INTRAMUSCULAR | Status: DC | PRN
  Administered 2019-09-17: 50 ug via INTRAVENOUS

## 2019-09-17 MED ORDER — LIDOCAINE HCL (PF) 2 % IJ SOLN
INTRAOCULAR | Status: DC | PRN
  Administered 2019-09-17: 1 mL

## 2019-09-17 SURGICAL SUPPLY — 20 items
CANNULA ANT/CHMB 27G (MISCELLANEOUS) ×2 IMPLANT
CANNULA ANT/CHMB 27GA (MISCELLANEOUS) ×6 IMPLANT
DISSECTOR HYDRO NUCLEUS 50X22 (MISCELLANEOUS) ×2 IMPLANT
GLOVE SURG LX 8.0 MICRO (GLOVE) ×2
GLOVE SURG LX STRL 8.0 MICRO (GLOVE) ×1 IMPLANT
GLOVE SURG TRIUMPH 8.0 PF LTX (GLOVE) ×3 IMPLANT
GOWN STRL REUS W/ TWL LRG LVL3 (GOWN DISPOSABLE) ×2 IMPLANT
GOWN STRL REUS W/TWL LRG LVL3 (GOWN DISPOSABLE) ×4
LENS IOL TECNIS ITEC 19.5 (Intraocular Lens) ×2 IMPLANT
MARKER SKIN DUAL TIP RULER LAB (MISCELLANEOUS) ×3 IMPLANT
NDL FILTER BLUNT 18X1 1/2 (NEEDLE) ×1 IMPLANT
NEEDLE FILTER BLUNT 18X 1/2SAF (NEEDLE) ×2
NEEDLE FILTER BLUNT 18X1 1/2 (NEEDLE) ×1 IMPLANT
PACK EYE AFTER SURG (MISCELLANEOUS) ×3 IMPLANT
PACK OPTHALMIC (MISCELLANEOUS) ×3 IMPLANT
PACK PORFILIO (MISCELLANEOUS) ×3 IMPLANT
SYR 3ML LL SCALE MARK (SYRINGE) ×3 IMPLANT
SYR TB 1ML LUER SLIP (SYRINGE) ×3 IMPLANT
WATER STERILE IRR 250ML POUR (IV SOLUTION) ×3 IMPLANT
WIPE NON LINTING 3.25X3.25 (MISCELLANEOUS) ×3 IMPLANT

## 2019-09-17 NOTE — Op Note (Signed)
PREOPERATIVE DIAGNOSIS:  Nuclear sclerotic cataract of the left eye.   POSTOPERATIVE DIAGNOSIS:  Nuclear sclerotic cataract of the left eye.   OPERATIVE PROCEDURE:@   SURGEON:  Birder Robson, MD.   ANESTHESIA:  Anesthesiologist: Page, Adele Barthel, MD CRNA: Mayme Genta, CRNA  1.      Managed anesthesia care. 2.     0.28m of Shugarcaine was instilled following the paracentesis   COMPLICATIONS:  None.   TECHNIQUE:   Stop and chop   DESCRIPTION OF PROCEDURE:  The patient was examined and consented in the preoperative holding area where the aforementioned topical anesthesia was applied to the left eye and then brought back to the Operating Room where the left eye was prepped and draped in the usual sterile ophthalmic fashion and a lid speculum was placed. A paracentesis was created with the side port blade and the anterior chamber was filled with viscoelastic. A near clear corneal incision was performed with the steel keratome. A continuous curvilinear capsulorrhexis was performed with a cystotome followed by the capsulorrhexis forceps. Hydrodissection and hydrodelineation were carried out with BSS on a blunt cannula. The lens was removed in a stop and chop  technique and the remaining cortical material was removed with the irrigation-aspiration handpiece. The capsular bag was inflated with viscoelastic and the Technis [id number] lens was placed in the capsular bag without complication. The remaining viscoelastic was removed from the eye with the irrigation-aspiration handpiece. The wounds were hydrated. The anterior chamber was flushed with BSS and the eye was inflated to physiologic pressure. 0.143mVigamox was placed in the anterior chamber. The wounds were found to be water tight. The eye was dressed with Combigan. The patient was given protective glasses to wear throughout the day and a shield with which to sleep tonight. The patient was also given drops with which to begin a drop regimen today and  will follow-up with me in one day. Implant Name Type Inv. Item Serial No. Manufacturer Lot No. LRB No. Used Action  LENS IOL DIOP 19.5 - [id number] Lens LENS IOL DIOP 19.5 [id number]  Left 1 Implanted    Procedure(s): CATARACT EXTRACTION PHACO AND INTRAOCULAR LENS PLACEMENT (IOC) LEFT 6.36  00:39.3 (Left)  Electronically signed: WiBirder Robson/23/2021 8:45 AM

## 2019-09-17 NOTE — H&P (Signed)
All labs reviewed. Abnormal studies sent to patients PCP when indicated.  Previous H&P reviewed, patient examined, there are NO CHANGES.  Richard Greiner Porfilio3/23/20218:21 AM

## 2019-09-17 NOTE — Anesthesia Postprocedure Evaluation (Signed)
Anesthesia Post Note  Patient: Corporate investment banker  Procedure(s) Performed: CATARACT EXTRACTION PHACO AND INTRAOCULAR LENS PLACEMENT (IOC) LEFT 6.36  00:39.3 (Left Eye)     Patient location during evaluation: PACU Anesthesia Type: MAC Level of consciousness: awake and alert Pain management: pain level controlled Vital Signs Assessment: post-procedure vital signs reviewed and stable Respiratory status: spontaneous breathing Cardiovascular status: blood pressure returned to baseline Postop Assessment: no apparent nausea or vomiting, adequate PO intake and no headache Anesthetic complications: no    Adele Barthel Doss Cybulski

## 2019-09-17 NOTE — Transfer of Care (Signed)
Immediate Anesthesia Transfer of Care Note  Patient: Richard Craig  Procedure(s) Performed: CATARACT EXTRACTION PHACO AND INTRAOCULAR LENS PLACEMENT (IOC) LEFT 6.36  00:39.3 (Left Eye)  Patient Location: PACU  Anesthesia Type: MAC  Level of Consciousness: awake, alert  and patient cooperative  Airway and Oxygen Therapy: Patient Spontanous Breathing and Patient connected to supplemental oxygen  Post-op Assessment: Post-op Vital signs reviewed, Patient's Cardiovascular Status Stable, Respiratory Function Stable, Patent Airway and No signs of Nausea or vomiting  Post-op Vital Signs: Reviewed and stable  Complications: No apparent anesthesia complications

## 2019-09-17 NOTE — Anesthesia Procedure Notes (Signed)
Procedure Name: MAC Performed by: Johaan Ryser, CRNA Pre-anesthesia Checklist: Patient identified, Emergency Drugs available, Suction available, Timeout performed and Patient being monitored Patient Re-evaluated:Patient Re-evaluated prior to induction Oxygen Delivery Method: Nasal cannula Placement Confirmation: positive ETCO2       

## 2019-09-17 NOTE — Anesthesia Preprocedure Evaluation (Signed)
Anesthesia Evaluation  Patient identified by MRN, date of birth, ID band Patient awake    History of Anesthesia Complications Negative for: history of anesthetic complications  Airway Mallampati: II  TM Distance: >3 FB Neck ROM: Full    Dental no notable dental hx.    Pulmonary neg pulmonary ROS,    Pulmonary exam normal        Cardiovascular Exercise Tolerance: Good negative cardio ROS Normal cardiovascular exam     Neuro/Psych negative neurological ROS     GI/Hepatic Neg liver ROS, Crohn's disease   Endo/Other  negative endocrine ROS  Renal/GU negative Renal ROS     Musculoskeletal   Abdominal   Peds  Hematology   Anesthesia Other Findings   Reproductive/Obstetrics                             Anesthesia Physical Anesthesia Plan  ASA: II  Anesthesia Plan: MAC   Post-op Pain Management:    Induction: Intravenous  PONV Risk Score and Plan: 1 and Treatment may vary due to age or medical condition and Midazolam  Airway Management Planned: Nasal Cannula and Natural Airway  Additional Equipment: None  Intra-op Plan:   Post-operative Plan:   Informed Consent: I have reviewed the patients History and Physical, chart, labs and discussed the procedure including the risks, benefits and alternatives for the proposed anesthesia with the patient or authorized representative who has indicated his/her understanding and acceptance.       Plan Discussed with: CRNA  Anesthesia Plan Comments:         Anesthesia Quick Evaluation

## 2019-09-18 ENCOUNTER — Encounter: Payer: Self-pay | Admitting: *Deleted

## 2019-11-20 ENCOUNTER — Other Ambulatory Visit: Payer: Self-pay

## 2019-11-20 ENCOUNTER — Encounter: Payer: Self-pay | Admitting: Ophthalmology

## 2019-11-29 ENCOUNTER — Other Ambulatory Visit
Admission: RE | Admit: 2019-11-29 | Discharge: 2019-11-29 | Disposition: A | Discharge status: Home / Self Care | Payer: Medicare Other | Source: Ambulatory Visit | Attending: Ophthalmology | Admitting: Ophthalmology

## 2019-11-29 ENCOUNTER — Other Ambulatory Visit: Payer: Self-pay

## 2019-11-29 DIAGNOSIS — Z01812 Encounter for preprocedural laboratory examination: Secondary | ICD-10-CM | POA: Insufficient documentation

## 2019-11-29 DIAGNOSIS — Z20822 Contact with and (suspected) exposure to covid-19: Secondary | ICD-10-CM | POA: Diagnosis not present

## 2019-11-29 NOTE — Discharge Instructions (Signed)

## 2019-11-30 LAB — SARS CORONAVIRUS 2 (TAT 6-24 HRS): SARS Coronavirus 2: NEGATIVE

## 2019-12-03 ENCOUNTER — Other Ambulatory Visit: Payer: Self-pay

## 2019-12-03 ENCOUNTER — Ambulatory Visit: Payer: Medicare Other | Admitting: Anesthesiology

## 2019-12-03 ENCOUNTER — Encounter: Payer: Self-pay | Admitting: Ophthalmology

## 2019-12-03 ENCOUNTER — Encounter
Admission: RE | Disposition: A | Discharge status: Home / Self Care | Payer: Self-pay | Source: Home / Self Care | Attending: Ophthalmology

## 2019-12-03 ENCOUNTER — Ambulatory Visit
Admission: RE | Admit: 2019-12-03 | Discharge: 2019-12-03 | Disposition: A | Discharge status: Home / Self Care | Payer: Medicare Other | Attending: Ophthalmology | Admitting: Ophthalmology

## 2019-12-03 DIAGNOSIS — E7800 Pure hypercholesterolemia, unspecified: Secondary | ICD-10-CM | POA: Insufficient documentation

## 2019-12-03 DIAGNOSIS — K5090 Crohn's disease, unspecified, without complications: Secondary | ICD-10-CM | POA: Diagnosis not present

## 2019-12-03 DIAGNOSIS — F419 Anxiety disorder, unspecified: Secondary | ICD-10-CM | POA: Diagnosis not present

## 2019-12-03 DIAGNOSIS — M1990 Unspecified osteoarthritis, unspecified site: Secondary | ICD-10-CM | POA: Insufficient documentation

## 2019-12-03 DIAGNOSIS — K219 Gastro-esophageal reflux disease without esophagitis: Secondary | ICD-10-CM | POA: Insufficient documentation

## 2019-12-03 DIAGNOSIS — H2511 Age-related nuclear cataract, right eye: Secondary | ICD-10-CM | POA: Insufficient documentation

## 2019-12-03 HISTORY — PX: CATARACT EXTRACTION W/PHACO: SHX586

## 2019-12-03 SURGERY — PHACOEMULSIFICATION, CATARACT, WITH IOL INSERTION
Anesthesia: Monitor Anesthesia Care | Site: Eye | Laterality: Right

## 2019-12-03 MED ORDER — EPINEPHRINE PF 1 MG/ML IJ SOLN
INTRAOCULAR | Status: DC | PRN
  Administered 2019-12-03: 46 mL via OPHTHALMIC

## 2019-12-03 MED ORDER — BRIMONIDINE TARTRATE-TIMOLOL 0.2-0.5 % OP SOLN
OPHTHALMIC | Status: DC | PRN
  Administered 2019-12-03: 1 [drp] via OPHTHALMIC

## 2019-12-03 MED ORDER — NA CHONDROIT SULF-NA HYALURON 40-17 MG/ML IO SOLN
INTRAOCULAR | Status: DC | PRN
  Administered 2019-12-03: 1 mL via INTRAOCULAR

## 2019-12-03 MED ORDER — FENTANYL CITRATE (PF) 100 MCG/2ML IJ SOLN
INTRAMUSCULAR | Status: DC | PRN
  Administered 2019-12-03: 50 ug via INTRAVENOUS

## 2019-12-03 MED ORDER — MOXIFLOXACIN HCL 0.5 % OP SOLN
OPHTHALMIC | Status: DC | PRN
  Administered 2019-12-03: 0.2 mL via OPHTHALMIC

## 2019-12-03 MED ORDER — MIDAZOLAM HCL 2 MG/2ML IJ SOLN
INTRAMUSCULAR | Status: DC | PRN
  Administered 2019-12-03: 1 mg via INTRAVENOUS

## 2019-12-03 MED ORDER — ARMC OPHTHALMIC DILATING DROPS
1.0000 "application " | OPHTHALMIC | Status: DC | PRN
  Administered 2019-12-03 (×3): 1 via OPHTHALMIC

## 2019-12-03 MED ORDER — LIDOCAINE HCL (PF) 2 % IJ SOLN
INTRAOCULAR | Status: DC | PRN
  Administered 2019-12-03: 1 mL

## 2019-12-03 MED ORDER — TETRACAINE HCL 0.5 % OP SOLN
1.0000 [drp] | OPHTHALMIC | Status: DC | PRN
  Administered 2019-12-03 (×3): 1 [drp] via OPHTHALMIC

## 2019-12-03 SURGICAL SUPPLY — 22 items
CANNULA ANT/CHMB 27G (MISCELLANEOUS) ×2 IMPLANT
CANNULA ANT/CHMB 27GA (MISCELLANEOUS) ×6 IMPLANT
GLOVE SURG LX 8.0 MICRO (GLOVE) ×4
GLOVE SURG LX STRL 8.0 MICRO (GLOVE) ×1 IMPLANT
GLOVE SURG TRIUMPH 8.0 PF LTX (GLOVE) ×3 IMPLANT
GOWN STRL REUS W/ TWL LRG LVL3 (GOWN DISPOSABLE) ×2 IMPLANT
GOWN STRL REUS W/TWL LRG LVL3 (GOWN DISPOSABLE) ×4
LENS IOL DIOP 20.0 (Intraocular Lens) ×3 IMPLANT
LENS IOL TECNIS MONO 20.0 (Intraocular Lens) IMPLANT
MARKER SKIN DUAL TIP RULER LAB (MISCELLANEOUS) ×3 IMPLANT
NDL FILTER BLUNT 18X1 1/2 (NEEDLE) ×1 IMPLANT
NEEDLE FILTER BLUNT 18X 1/2SAF (NEEDLE) ×2
NEEDLE FILTER BLUNT 18X1 1/2 (NEEDLE) ×1 IMPLANT
PACK EYE AFTER SURG (MISCELLANEOUS) ×3 IMPLANT
PACK OPTHALMIC (MISCELLANEOUS) ×3 IMPLANT
PACK PORFILIO (MISCELLANEOUS) ×3 IMPLANT
SUT ETHILON 10-0 CS-B-6CS-B-6 (SUTURE)
SUTURE EHLN 10-0 CS-B-6CS-B-6 (SUTURE) IMPLANT
SYR 3ML LL SCALE MARK (SYRINGE) ×3 IMPLANT
SYR TB 1ML LUER SLIP (SYRINGE) ×3 IMPLANT
WATER STERILE IRR 250ML POUR (IV SOLUTION) ×3 IMPLANT
WIPE NON LINTING 3.25X3.25 (MISCELLANEOUS) ×3 IMPLANT

## 2019-12-03 NOTE — H&P (Signed)
All labs reviewed. Abnormal studies sent to patients PCP when indicated.  Previous H&P reviewed, patient examined, there are NO CHANGES.  Richard Stillings Porfilio6/8/202111:33 AM

## 2019-12-03 NOTE — Anesthesia Procedure Notes (Signed)
Procedure Name: MAC Date/Time: 12/03/2019 11:41 AM Performed by: Cameron Ali, CRNA Pre-anesthesia Checklist: Patient identified, Emergency Drugs available, Suction available, Timeout performed and Patient being monitored Patient Re-evaluated:Patient Re-evaluated prior to induction Oxygen Delivery Method: Nasal cannula Placement Confirmation: positive ETCO2

## 2019-12-03 NOTE — Op Note (Signed)
PREOPERATIVE DIAGNOSIS:  Nuclear sclerotic cataract of the right eye.   POSTOPERATIVE DIAGNOSIS:  Cataract   OPERATIVE PROCEDURE:@   SURGEON:  Birder Robson, MD.   ANESTHESIA:  Anesthesiologist: Page, Adele Barthel, MD CRNA: Cameron Ali, CRNA  1.      Managed anesthesia care. 2.      0.23m of Shugarcaine was instilled in the eye following the paracentesis.   COMPLICATIONS:  None.   TECHNIQUE:   Stop and chop   DESCRIPTION OF PROCEDURE:  The patient was examined and consented in the preoperative holding area where the aforementioned topical anesthesia was applied to the right eye and then brought back to the Operating Room where the right eye was prepped and draped in the usual sterile ophthalmic fashion and a lid speculum was placed. A paracentesis was created with the side port blade and the anterior chamber was filled with viscoelastic. A near clear corneal incision was performed with the steel keratome. A continuous curvilinear capsulorrhexis was performed with a cystotome followed by the capsulorrhexis forceps. Hydrodissection and hydrodelineation were carried out with BSS on a blunt cannula. The lens was removed in a stop and chop  technique and the remaining cortical material was removed with the irrigation-aspiration handpiece. The capsular bag was inflated with viscoelastic and the Technis [id number]  lens was placed in the capsular bag without complication. The remaining viscoelastic was removed from the eye with the irrigation-aspiration handpiece. The wounds were hydrated. The anterior chamber was flushed with BSS and the eye was inflated to physiologic pressure. 0.111mof Vigamox was placed in the anterior chamber. The wounds were found to be water tight. The eye was dressed with Combigan. The patient was given protective glasses to wear throughout the day and a shield with which to sleep tonight. The patient was also given drops with which to begin a drop regimen today and will follow-up  with me in one day. Implant Name Type Inv. Item Serial No. Manufacturer Lot No. LRB No. Used Action  LENS IOL DIOP 20.0 - [id number] Lens LENS IOL DIOP 20.0 [id number]  Right 1 Implanted   Procedure(s) with comments: CATARACT EXTRACTION PHACO AND INTRAOCULAR LENS PLACEMENT (IOC) RIGHT (Right) - 4.02 0:33.6  Electronically signed: WiBirder Robson/01/2020 11:59 AM

## 2019-12-03 NOTE — Anesthesia Preprocedure Evaluation (Signed)
Anesthesia Evaluation  Patient identified by MRN, date of birth, ID band Patient awake    History of Anesthesia Complications Negative for: history of anesthetic complications  Airway Mallampati: II  TM Distance: >3 FB Neck ROM: Full    Dental no notable dental hx.    Pulmonary neg pulmonary ROS,    Pulmonary exam normal        Cardiovascular Exercise Tolerance: Good negative cardio ROS Normal cardiovascular exam     Neuro/Psych    GI/Hepatic Neg liver ROS, GERD  Medicated,  Endo/Other  negative endocrine ROS  Renal/GU negative Renal ROS     Musculoskeletal   Abdominal   Peds  Hematology negative hematology ROS (+)   Anesthesia Other Findings   Reproductive/Obstetrics                            Anesthesia Physical Anesthesia Plan  ASA: II  Anesthesia Plan: MAC   Post-op Pain Management:    Induction: Intravenous  PONV Risk Score and Plan: Treatment may vary due to age or medical condition and Midazolam  Airway Management Planned: Nasal Cannula and Natural Airway  Additional Equipment: None  Intra-op Plan:   Post-operative Plan:   Informed Consent: I have reviewed the patients History and Physical, chart, labs and discussed the procedure including the risks, benefits and alternatives for the proposed anesthesia with the patient or authorized representative who has indicated his/her understanding and acceptance.       Plan Discussed with: CRNA  Anesthesia Plan Comments:         Anesthesia Quick Evaluation

## 2019-12-03 NOTE — Anesthesia Postprocedure Evaluation (Signed)
Anesthesia Post Note  Patient: Passenger transport manager) Performed: CATARACT EXTRACTION PHACO AND INTRAOCULAR LENS PLACEMENT (IOC) RIGHT (Right Eye)     Patient location during evaluation: PACU Anesthesia Type: MAC Level of consciousness: awake and alert Pain management: pain level controlled Vital Signs Assessment: post-procedure vital signs reviewed and stable Respiratory status: spontaneous breathing Cardiovascular status: blood pressure returned to baseline Postop Assessment: no apparent nausea or vomiting, adequate PO intake and no headache Anesthetic complications: no    Adele Barthel Tymier Lindholm

## 2019-12-03 NOTE — Transfer of Care (Signed)
Immediate Anesthesia Transfer of Care Note  Patient: Richard Craig  Procedure(s) Performed: CATARACT EXTRACTION PHACO AND INTRAOCULAR LENS PLACEMENT (IOC) RIGHT (Right Eye)  Patient Location: PACU  Anesthesia Type: MAC  Level of Consciousness: awake, alert  and patient cooperative  Airway and Oxygen Therapy: Patient Spontanous Breathing and Patient connected to supplemental oxygen  Post-op Assessment: Post-op Vital signs reviewed, Patient's Cardiovascular Status Stable, Respiratory Function Stable, Patent Airway and No signs of Nausea or vomiting  Post-op Vital Signs: Reviewed and stable  Complications: No apparent anesthesia complications

## 2019-12-04 ENCOUNTER — Encounter: Payer: Self-pay | Admitting: *Deleted

## 2020-04-06 ENCOUNTER — Ambulatory Visit: Payer: Medicare Other | Attending: Internal Medicine

## 2020-04-06 DIAGNOSIS — Z23 Encounter for immunization: Secondary | ICD-10-CM

## 2020-04-06 NOTE — Progress Notes (Signed)
   Covid-19 Vaccination Clinic  Name:  Richard Craig    MRN: [id number] DOB: Oct 14, 1940  04/06/2020  [name] was observed post Covid-19 immunization for 15 minutes without incident. He was provided with Vaccine Information Sheet and instruction to access the V-Safe system.   [name] was instructed to call 911 with any severe reactions post vaccine: Marland Kitchen Difficulty breathing  . Swelling of face and throat  . A fast heartbeat  . A bad rash all over body  . Dizziness and weakness

## 2020-06-23 ENCOUNTER — Ambulatory Visit (LOCAL_COMMUNITY_HEALTH_CENTER): Payer: Self-pay

## 2020-06-23 ENCOUNTER — Other Ambulatory Visit: Payer: Self-pay

## 2020-06-23 VITALS — Wt 180.0 lb

## 2020-06-23 DIAGNOSIS — R7612 Nonspecific reaction to cell mediated immunity measurement of gamma interferon antigen response without active tuberculosis: Secondary | ICD-10-CM

## 2020-06-28 ENCOUNTER — Other Ambulatory Visit: Payer: Self-pay

## 2020-06-28 DIAGNOSIS — E785 Hyperlipidemia, unspecified: Secondary | ICD-10-CM | POA: Insufficient documentation

## 2020-06-28 DIAGNOSIS — G4700 Insomnia, unspecified: Secondary | ICD-10-CM | POA: Insufficient documentation

## 2020-06-28 DIAGNOSIS — G43909 Migraine, unspecified, not intractable, without status migrainosus: Secondary | ICD-10-CM | POA: Insufficient documentation

## 2020-06-28 DIAGNOSIS — R7612 Nonspecific reaction to cell mediated immunity measurement of gamma interferon antigen response without active tuberculosis: Secondary | ICD-10-CM

## 2020-06-28 DIAGNOSIS — B399 Histoplasmosis, unspecified: Secondary | ICD-10-CM | POA: Insufficient documentation

## 2020-06-28 DIAGNOSIS — N400 Enlarged prostate without lower urinary tract symptoms: Secondary | ICD-10-CM | POA: Insufficient documentation

## 2020-06-28 NOTE — Progress Notes (Signed)
Tuberculosis treatment orders  All patients are to be monitored per [place] and county TB policies.   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 Treat for latent TB per the following:  Rifapentine 961m, Isoniazid 9047mand B6 5016mnce weekly PO x 12 weeks. Monthly LFTs; Baseline labs at TBM start visit.  Offer HIV testing.  EPI completed via phone d/t covid pandemic.   +QFT 06/21/20; CXR without Active TB on 06/22/20 ([facility]); see scanned.  Previous labs in [place]) Currently on Humira for Crohn's; see problem and medication list.

## 2020-06-28 NOTE — Progress Notes (Signed)
EPI completed via phone d/t rising covid numbers.  [facility] patient referred to [facility] for +QFT; CXR without Active TB.  Hx of Crohn's; currently on Humira. CBC and LFTs drawn in October 2021.  Discussed LTBI vs Active TB. Discussed with patient LTBI tx regimens; patient to f/u with TB RB re: which regimen he will choose. Aileen Fass, RN

## 2020-07-02 ENCOUNTER — Other Ambulatory Visit: Payer: Self-pay

## 2020-07-02 ENCOUNTER — Ambulatory Visit (LOCAL_COMMUNITY_HEALTH_CENTER): Payer: Medicare Other

## 2020-07-02 VITALS — Wt 179.0 lb

## 2020-07-02 DIAGNOSIS — R7612 Nonspecific reaction to cell mediated immunity measurement of gamma interferon antigen response without active tuberculosis: Secondary | ICD-10-CM

## 2020-07-03 LAB — CBC WITH DIFFERENTIAL/PLATELET
Basophils Absolute: 0 10*3/uL (ref 0.0–0.2)
Basos: 0 %
EOS (ABSOLUTE): 0.1 10*3/uL (ref 0.0–0.4)
Eos: 1 %
Hematocrit: 45.6 % (ref 37.5–51.0)
Hemoglobin: 15.5 g/dL (ref 13.0–17.7)
Immature Grans (Abs): 0 10*3/uL (ref 0.0–0.1)
Immature Granulocytes: 0 %
Lymphocytes Absolute: 2.3 10*3/uL (ref 0.7–3.1)
Lymphs: 28 %
MCH: 30.1 pg (ref 26.6–33.0)
MCHC: 34 g/dL (ref 31.5–35.7)
MCV: 89 fL (ref 79–97)
Monocytes Absolute: 0.6 10*3/uL (ref 0.1–0.9)
Monocytes: 7 %
Neutrophils Absolute: 5.2 10*3/uL (ref 1.4–7.0)
Neutrophils: 64 %
Platelets: 309 10*3/uL (ref 150–450)
RBC: 5.15 x10E6/uL (ref 4.14–5.80)
RDW: 13.2 % (ref 11.6–15.4)
WBC: 8.2 10*3/uL (ref 3.4–10.8)

## 2020-07-03 LAB — HEPATIC FUNCTION PANEL
ALT: 24 IU/L (ref 0–44)
AST: 21 IU/L (ref 0–40)
Albumin: 4.5 g/dL (ref 3.7–4.7)
Alkaline Phosphatase: 73 IU/L (ref 44–121)
Bilirubin Total: 0.3 mg/dL (ref 0.0–1.2)
Bilirubin, Direct: 0.12 mg/dL (ref 0.00–0.40)
Total Protein: 6.9 g/dL (ref 6.0–8.5)

## 2020-07-06 NOTE — Progress Notes (Signed)
Curator for TB RN: I agree with the care provided to this patient and was available for any consultation.  I documentation reflects my recommendations for treatment.   Caren Macadam, MD, MPH, ABFM [facility] Medical Director

## 2020-07-09 ENCOUNTER — Encounter: Payer: Self-pay | Admitting: Student

## 2020-07-09 LAB — HM HIV SCREENING LAB: HM HIV Screening: NEGATIVE

## 2020-07-16 MED ORDER — PYRIDOXINE HCL 25 MG PO TABS
50.0000 mg | ORAL_TABLET | ORAL | 0 refills | Status: AC
Start: 2020-07-02 — End: 2020-07-24

## 2020-07-16 MED ORDER — RIFAPENTINE 150 MG PO TABS
900.0000 mg | ORAL_TABLET | ORAL | 0 refills | Status: AC
Start: 2020-07-16 — End: 2020-08-07

## 2020-07-16 MED ORDER — ISONIAZID 300 MG PO TABS
900.0000 mg | ORAL_TABLET | ORAL | 0 refills | Status: AC
Start: 2020-07-16 — End: 2020-08-07

## 2020-07-22 ENCOUNTER — Ambulatory Visit (LOCAL_COMMUNITY_HEALTH_CENTER): Payer: Medicare Other

## 2020-07-22 ENCOUNTER — Other Ambulatory Visit: Payer: Self-pay

## 2020-07-22 VITALS — Ht 69.0 in | Wt 177.0 lb

## 2020-07-22 DIAGNOSIS — R7612 Nonspecific reaction to cell mediated immunity measurement of gamma interferon antigen response without active tuberculosis: Secondary | ICD-10-CM

## 2020-07-22 MED ORDER — PYRIDOXINE HCL 25 MG PO TABS
50.0000 mg | ORAL_TABLET | ORAL | 0 refills | Status: AC
Start: 2020-07-22 — End: 2020-08-13

## 2020-07-22 MED ORDER — RIFAPENTINE 150 MG PO TABS
900.0000 mg | ORAL_TABLET | ORAL | 0 refills | Status: AC
Start: 2020-07-22 — End: 2020-08-13

## 2020-07-22 MED ORDER — ISONIAZID 300 MG PO TABS
900.0000 mg | ORAL_TABLET | ORAL | 0 refills | Status: AC
Start: 2020-07-22 — End: 2020-08-13

## 2020-07-23 LAB — HEPATIC FUNCTION PANEL
ALT: 26 IU/L (ref 0–44)
AST: 25 IU/L (ref 0–40)
Albumin: 4.3 g/dL (ref 3.7–4.7)
Alkaline Phosphatase: 71 IU/L (ref 44–121)
Bilirubin Total: 0.2 mg/dL (ref 0.0–1.2)
Bilirubin, Direct: 0.11 mg/dL (ref 0.00–0.40)
Total Protein: 6.9 g/dL (ref 6.0–8.5)

## 2020-07-23 NOTE — Progress Notes (Signed)
Rifapentine, INH and B6 dispensed per [facility] orders for 2nd month (4 weeks) of LTBI tx.  Patient reported feeling anxious earlier in the week but has cut his tranxene in half d/t a drug shortage.  TB RN consulted with Dr. Ernestina Patches and agree anxiety is not from TB meds.   Co patient to decrease caffeine, continue taking daily walks with wife and limit the news. Wife and patient agreed this will help improve anxious sx's .Aileen Fass, RN

## 2020-08-26 ENCOUNTER — Ambulatory Visit (LOCAL_COMMUNITY_HEALTH_CENTER): Payer: Medicare Other

## 2020-08-26 ENCOUNTER — Other Ambulatory Visit: Payer: Self-pay

## 2020-08-26 VITALS — Wt 180.0 lb

## 2020-08-26 DIAGNOSIS — R7612 Nonspecific reaction to cell mediated immunity measurement of gamma interferon antigen response without active tuberculosis: Secondary | ICD-10-CM

## 2020-08-26 MED ORDER — PYRIDOXINE HCL 25 MG PO TABS
50.0000 mg | ORAL_TABLET | Freq: Every day | ORAL | 0 refills | Status: AC
Start: 2020-08-26 — End: 2020-08-30

## 2020-08-26 MED ORDER — RIFAPENTINE 150 MG PO TABS
900.0000 mg | ORAL_TABLET | ORAL | 0 refills | Status: AC
Start: 2020-08-26 — End: 2020-09-17

## 2020-08-26 MED ORDER — ISONIAZID 300 MG PO TABS
900.0000 mg | ORAL_TABLET | Freq: Every day | ORAL | 0 refills | Status: AC
Start: 2020-08-26 — End: 2020-09-17

## 2020-08-26 NOTE — Progress Notes (Signed)
Rifapentine, INH and B6 dispensed for patient's 3rd/last month of LTBI tx.  TB RN will notify patient's referring physician of completion. Aileen Fass, RN

## 2020-08-27 LAB — HEPATIC FUNCTION PANEL
ALT: 22 IU/L (ref 0–44)
AST: 24 IU/L (ref 0–40)
Albumin: 4.3 g/dL (ref 3.7–4.7)
Alkaline Phosphatase: 69 IU/L (ref 44–121)
Bilirubin Total: 0.2 mg/dL (ref 0.0–1.2)
Bilirubin, Direct: 0.1 mg/dL (ref 0.00–0.40)
Total Protein: 6.7 g/dL (ref 6.0–8.5)

## 2022-04-04 ENCOUNTER — Encounter
Admission: RE | Disposition: A | Discharge status: Home / Self Care | Payer: Self-pay | Source: Home / Self Care | Attending: Gastroenterology

## 2022-04-04 ENCOUNTER — Encounter: Payer: Self-pay | Admitting: *Deleted

## 2022-04-04 ENCOUNTER — Ambulatory Visit
Admission: RE | Admit: 2022-04-04 | Discharge: 2022-04-04 | Disposition: A | Discharge status: Home / Self Care | Payer: Medicare Other | Attending: Gastroenterology | Admitting: Gastroenterology

## 2022-04-04 ENCOUNTER — Ambulatory Visit: Payer: Medicare Other | Admitting: Certified Registered Nurse Anesthetist

## 2022-04-04 DIAGNOSIS — K640 First degree hemorrhoids: Secondary | ICD-10-CM | POA: Diagnosis not present

## 2022-04-04 DIAGNOSIS — Z79899 Other long term (current) drug therapy: Secondary | ICD-10-CM | POA: Diagnosis not present

## 2022-04-04 DIAGNOSIS — K5730 Diverticulosis of large intestine without perforation or abscess without bleeding: Secondary | ICD-10-CM | POA: Diagnosis not present

## 2022-04-04 DIAGNOSIS — K50812 Crohn's disease of both small and large intestine with intestinal obstruction: Secondary | ICD-10-CM | POA: Insufficient documentation

## 2022-04-04 DIAGNOSIS — K219 Gastro-esophageal reflux disease without esophagitis: Secondary | ICD-10-CM | POA: Diagnosis not present

## 2022-04-04 HISTORY — PX: COLONOSCOPY WITH PROPOFOL: SHX5780

## 2022-04-04 SURGERY — COLONOSCOPY WITH PROPOFOL
Anesthesia: General

## 2022-04-04 MED ORDER — GLYCOPYRROLATE 0.2 MG/ML IJ SOLN
INTRAMUSCULAR | Status: AC
  Filled 2022-04-04: qty 1

## 2022-04-04 MED ORDER — SODIUM CHLORIDE 0.9 % IV SOLN: INTRAVENOUS | Status: DC

## 2022-04-04 MED ORDER — LIDOCAINE HCL (CARDIAC) PF 100 MG/5ML IV SOSY
PREFILLED_SYRINGE | INTRAVENOUS | Status: DC | PRN
  Administered 2022-04-04: 50 mg via INTRAVENOUS

## 2022-04-04 MED ORDER — PROPOFOL 500 MG/50ML IV EMUL
INTRAVENOUS | Status: DC | PRN
  Administered 2022-04-04: 150 ug/kg/min via INTRAVENOUS

## 2022-04-04 MED ORDER — PROPOFOL 10 MG/ML IV BOLUS
INTRAVENOUS | Status: DC | PRN
  Administered 2022-04-04: 70 mg via INTRAVENOUS

## 2022-04-04 MED ORDER — GLYCOPYRROLATE 0.2 MG/ML IJ SOLN
INTRAMUSCULAR | Status: DC | PRN
  Administered 2022-04-04: .2 mg via INTRAVENOUS

## 2022-04-04 NOTE — Interval H&P Note (Signed)
History and Physical Interval Note:  04/04/2022 8:54 AM  Richard Craig  has presented today for surgery, with the diagnosis of CRHON'S dISEASE.  The various methods of treatment have been discussed with the patient and family. After consideration of risks, benefits and other options for treatment, the patient has consented to  Procedure(s): COLONOSCOPY WITH PROPOFOL (N/A) as a surgical intervention.  The patient's history has been reviewed, patient examined, no change in status, stable for surgery.  I have reviewed the patient's chart and labs.  Questions were answered to the patient's satisfaction.     [name]  Ok to proceed with colonoscopy

## 2022-04-04 NOTE — Anesthesia Preprocedure Evaluation (Signed)
Anesthesia Evaluation  Patient identified by MRN, date of birth, ID band Patient awake    Reviewed: Allergy & Precautions, NPO status , Patient's Chart, lab work & pertinent test results  Airway Mallampati: II  TM Distance: <3 FB Neck ROM: Full    Dental  (+) Partial Upper, Partial Lower   Pulmonary neg pulmonary ROS, former smoker,    Pulmonary exam normal breath sounds clear to auscultation       Cardiovascular Exercise Tolerance: Good negative cardio ROS Normal cardiovascular exam Rhythm:Regular     Neuro/Psych  Headaches, negative neurological ROS  negative psych ROS   GI/Hepatic negative GI ROS, Neg liver ROS, GERD  Medicated,  Endo/Other  negative endocrine ROS  Renal/GU negative Renal ROS  negative genitourinary   Musculoskeletal negative musculoskeletal ROS (+)   Abdominal Normal abdominal exam  (+)   Peds negative pediatric ROS (+)  Hematology negative hematology ROS (+)   Anesthesia Other Findings Past Medical History: No date: BPH (benign prostatic hyperplasia) 2016: Crohn's colitis ([place]) No date: Diverticulitis 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 No date: GERD (gastroesophageal reflux disease) No date: Headache     Comment:  couple times per month 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 No date: Hyperlipemia No date: Insomnia No date: Wears dentures     Comment:  partial upper and lower  Past Surgical History: 09/17/2019: CATARACT EXTRACTION W/PHACO; Left     Comment:  Procedure: CATARACT EXTRACTION PHACO AND INTRAOCULAR               LENS PLACEMENT (IOC) LEFT 6.36  00:39.3;  Surgeon:               Birder Robson, MD;  Location: [place];                Service: Ophthalmology;  Laterality: Left; 12/03/2019: CATARACT EXTRACTION W/PHACO; Right     Comment:  Procedure: CATARACT EXTRACTION PHACO AND INTRAOCULAR               LENS PLACEMENT (IOC) RIGHT;  Surgeon: Birder Robson,                MD;  Location: [place];  Service:               Ophthalmology;  Laterality: Right;  4.02 0:33.6 03/06/2015: COLONOSCOPY WITH PROPOFOL; N/A     Comment:  Procedure: COLONOSCOPY WITH PROPOFOL;  Surgeon: Lollie Sails, MD;  Location: [facility] ENDOSCOPY;  Service:               Endoscopy;  Laterality: N/A; 12/07/2015: COLONOSCOPY WITH PROPOFOL; N/A     Comment:  Procedure: COLONOSCOPY WITH PROPOFOL;  Surgeon: Lollie Sails, MD;  Location: [facility] ENDOSCOPY;  Service:               Endoscopy;  Laterality: N/A; 03/12/2019: COLONOSCOPY WITH PROPOFOL; N/A     Comment:  Procedure: COLONOSCOPY WITH PROPOFOL;  Surgeon:               Lollie Sails, MD;  Location: [facility] ENDOSCOPY;                Service: Endoscopy;  Laterality: N/A; No date: ESOPHAGOGASTRODUODENOSCOPY No date: EYE SURGERY No date: hystoplasmosis  BMI    Body Mass Index: 25.60 kg/m      Reproductive/Obstetrics negative OB ROS  Anesthesia Physical Anesthesia Plan  ASA: 2  Anesthesia Plan: General   Post-op Pain Management:    Induction: Intravenous  PONV Risk Score and Plan: Propofol infusion and TIVA  Airway Management Planned: Natural Airway  Additional Equipment:   Intra-op Plan:   Post-operative Plan:   Informed Consent: I have reviewed the patients History and Physical, chart, labs and discussed the procedure including the risks, benefits and alternatives for the proposed anesthesia with the patient or authorized representative who has indicated his/her understanding and acceptance.     Dental Advisory Given  Plan Discussed with: CRNA and Surgeon  Anesthesia Plan Comments:         Anesthesia Quick Evaluation

## 2022-04-04 NOTE — Op Note (Signed)
[facility] Gastroenterology Patient Name: Richard Craig Procedure Date: 04/04/2022 8:39 AM MRN: [id number] Account #: [id number] Date of Birth: May 13, 1941 Admit Type: Outpatient Age: 81 Room: [facility] ENDO ROOM 3 Gender: Male Note Status: Finalized Instrument Name: Jasper Riling [id number] Procedure:             Colonoscopy Indications:           Crohn's disease of the small bowel and colon Providers:             Andrey Farmer MD, MD Referring MD:          Irven Easterly. Kary Kos, MD (Referring MD) Medicines:             Monitored Anesthesia Care Complications:         No immediate complications. Estimated blood loss:                         Minimal. Procedure:             Pre-Anesthesia Assessment:                        - Prior to the procedure, a History and Physical was                         performed, and patient medications and allergies were                         reviewed. The patient is competent. The risks and                         benefits of the procedure and the sedation options and                         risks were discussed with the patient. All questions                         were answered and informed consent was obtained.                         Patient identification and proposed procedure were                         verified by the physician, the nurse, the                         anesthesiologist, the anesthetist and the technician                         in the endoscopy suite. Mental Status Examination:                         alert and oriented. Airway Examination: normal                         oropharyngeal airway and neck mobility. Respiratory                         Examination: clear to auscultation. CV Examination:  normal. Prophylactic Antibiotics: The patient does not                         require prophylactic antibiotics. Prior                         Anticoagulants: The patient has taken no previous                          anticoagulant or antiplatelet agents. ASA Grade                         Assessment: II - A patient with mild systemic disease.                         After reviewing the risks and benefits, the patient                         was deemed in satisfactory condition to undergo the                         procedure. The anesthesia plan was to use monitored                         anesthesia care (MAC). Immediately prior to                         administration of medications, the patient was                         re-assessed for adequacy to receive sedatives. The                         heart rate, respiratory rate, oxygen saturations,                         blood pressure, adequacy of pulmonary ventilation, and                         response to care were monitored throughout the                         procedure. The physical status of the patient was                         re-assessed after the procedure.                        After obtaining informed consent, the colonoscope was                         passed under direct vision. Throughout the procedure,                         the patient's blood pressure, pulse, and oxygen                         saturations were monitored continuously. The  Colonoscope was introduced through the anus and                         advanced to the the terminal ileum. The colonoscopy                         was performed without difficulty. The patient                         tolerated the procedure well. The quality of the bowel                         preparation was good. Findings:      The perianal and digital rectal examinations were normal.      The ileocecal valve contained a benign-appearing, intrinsic moderate       stenosis that was non-traversed. I was able to briefly see into the       terminal ileum and there appeared to be several ulcerations.      Multiple small-mouthed diverticula were found in  the sigmoid colon and       descending colon.      Normal mucosa was found in the entire colon. Biopsies were taken with a       cold forceps for histology. Estimated blood loss was minimal.      Internal hemorrhoids were found during retroflexion. The hemorrhoids       were Grade I (internal hemorrhoids that do not prolapse).      The exam was otherwise without abnormality on direct and retroflexion       views. Impression:            - Stricture at the ileocecal valve.                        - Diverticulosis in the sigmoid colon and in the                         descending colon.                        - Normal mucosa in the entire examined colon. Biopsied.                        - Internal hemorrhoids.                        - The examination was otherwise normal on direct and                         retroflexion views. Recommendation:        - Discharge patient to home.                        - Resume previous diet.                        - Continue present medications.                        - Await pathology results.                        -  Perform a computed tomographic (CT scan)                         enterography at appointment to be scheduled.                        - Return to referring physician as previously                         scheduled. Procedure Code(s):     --- Professional ---                        [phone], Colonoscopy, flexible; with biopsy, single or                         multiple Diagnosis Code(s):     --- Professional ---                        K64.0, First degree hemorrhoids                        K50.812, Crohn's disease of both small and large                         intestine with intestinal obstruction                        K57.30, Diverticulosis of large intestine without                         perforation or abscess without bleeding CPT copyright 2019 American Medical Association. All rights reserved. The codes documented in this report are  preliminary and upon coder review may  be revised to meet current compliance requirements. Andrey Farmer MD, MD 04/04/2022 9:25:12 AM Number of Addenda: 0 Note Initiated On: 04/04/2022 8:39 AM Scope Withdrawal Time: 0 hours 13 minutes 48 seconds  Total Procedure Duration: 0 hours 18 minutes 24 seconds  Estimated Blood Loss:  Estimated blood loss was minimal.      [facility]

## 2022-04-04 NOTE — Transfer of Care (Signed)
Immediate Anesthesia Transfer of Care Note  Patient: Richard Craig  Procedure(s) Performed: COLONOSCOPY WITH PROPOFOL  Patient Location: PACU and Endoscopy Unit  Anesthesia Type:General  Level of Consciousness: drowsy  Airway & Oxygen Therapy: Patient Spontanous Breathing  Post-op Assessment: Report given to RN and Post -op Vital signs reviewed and stable  Post vital signs: Reviewed and stable  Last Vitals:  Vitals Value Taken Time  BP 108/75 04/04/22 0924  Temp    Pulse 71 04/04/22 0925  Resp 17 04/04/22 0925  SpO2 92 % 04/04/22 0925  Vitals shown include unvalidated device data.  Last Pain:  Vitals:   04/04/22 0756  TempSrc: Temporal  PainSc: 0-No pain         Complications: No notable events documented.

## 2022-04-04 NOTE — Anesthesia Postprocedure Evaluation (Signed)
Anesthesia Post Note  Patient: Richard Craig) Performed: COLONOSCOPY WITH PROPOFOL  Patient location during evaluation: PACU Anesthesia Type: General Level of consciousness: awake and oriented Pain management: pain level controlled Vital Signs Assessment: post-procedure vital signs reviewed and stable Respiratory status: spontaneous breathing and respiratory function stable Cardiovascular status: stable Anesthetic complications: no   No notable events documented.   Last Vitals:  Vitals:   04/04/22 0924 04/04/22 0934  BP: 108/75 139/83  Pulse: 71   Resp:  16  Temp: (!) 36.1 C   SpO2: 92%     Last Pain:  Vitals:   04/04/22 0934  TempSrc:   PainSc: 0-No pain                 VAN STAVEREN,Aneira Cavitt

## 2022-04-04 NOTE — Anesthesia Procedure Notes (Signed)
Procedure Name: MAC Date/Time: 04/04/2022 8:46 AM  Performed by: Tollie Eth, CRNAPre-anesthesia Checklist: Patient identified, Emergency Drugs available, Suction available and Patient being monitored Patient Re-evaluated:Patient Re-evaluated prior to induction Oxygen Delivery Method: Nasal cannula Induction Type: IV induction Placement Confirmation: positive ETCO2

## 2022-04-04 NOTE — H&P (Signed)
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 Last colonoscopy in 2020. No blood thinners. History of ex-lap in the 1970's.    Current Facility-Administered Medications:    0.9 %  sodium chloride infusion, , Intravenous, Continuous, Draydon Clairmont, Hilton Cork, MD, New Bag at 04/04/22 [phone]  Medications Prior to Admission  Medication Sig Dispense Refill Last Dose   Adalimumab (HUMIRA PEN-CROHNS STARTER ) Inject into the skin.   Past Week   Adalimumab (HUMIRA) 40 MG/0.8ML PSKT INJECT 0.8 MLS SUBCUTANEOUSLY EVERY 14 DAYS FOR 30 DAYS   Past Week   Cholecalciferol (VITAMIN D3) 50 MCG (2000 UT) TABS Take by mouth.   Past Week   Cholecalciferol 25 MCG (1000 UT) capsule Take by mouth.   Past Week   HUMIRA 40 MG/0.8ML PSKT INJECT 0.8 MLS SUBCUTANEOUSLY EVERY 14 DAYS FOR 30 DAYS   Past Week   HUMIRA PEN 40 MG/0.8ML PNKT Inject into the skin.   Past Week   Multiple Vitamin (MULTIVITAMIN) capsule Take 1 capsule by mouth daily.   Past Week   OMEGA FATTY ACIDS-PHYTOSTEROLS PO Take by mouth.   Past Week   Omega-3 Fatty Acids (FISH OIL) 1000 MG CAPS Take by mouth.   Past Week   omeprazole (PRILOSEC) 40 MG capsule Take 40 mg by mouth daily.   Past Week   Saw Palmetto 450 MG CAPS Take by mouth 4 (four) times daily.   Past Week   simvastatin (ZOCOR) 40 MG tablet Take 40 mg by mouth daily.   Past Week   traZODone (DESYREL) 50 MG tablet Take 50 mg by mouth at bedtime.   04/03/2022   hydrocortisone (ANUSOL-HC) 2.5 % rectal cream Place 1 application rectally 2 (two) times daily. (Patient not taking: Reported on 04/04/2022)   Not Taking   simvastatin (ZOCOR) 40 MG tablet Take 1 tablet by mouth daily.      SUMAtriptan (IMITREX) 100 MG tablet Take 100 mg by mouth every 2 (two)  hours as needed for migraine. May repeat in 2 hours if headache persists or recurs.    at prn   tadalafil (CIALIS) 20 MG tablet Take 20 mg by mouth daily as needed for erectile dysfunction.    at prn   traZODone (DESYREL) 50 MG tablet Take by mouth.        No Known Allergies   Past Medical History:  Diagnosis Date   BPH (benign prostatic hyperplasia)    Crohn's colitis ([place]) 2016   Diverticulitis    Focal lymphocytic colitis    GERD (gastroesophageal reflux disease)    Headache    couple times per month   Histoplasmosis    Hyperlipemia    Insomnia    Wears dentures    partial upper and lower    Review of systems:  Otherwise negative.    Physical Exam  Gen: Alert, oriented. Appears stated age.  HEENT:  PERRLA. Lungs: No respiratory distress CV: RRR Abd: soft, benign, no masses Ext: No edema    Planned procedures: Proceed with colonoscopy. The patient understands the nature of the planned procedure, indications, risks, alternatives and potential complications including but not limited to bleeding, infection, perforation, damage to internal organs and possible oversedation/side effects from anesthesia. The patient agrees and gives consent to proceed.  Please  refer to procedure notes for findings, recommendations and patient disposition/instructions.     Richard Rubenstein, MD [facility] Gastroenterology

## 2022-04-05 ENCOUNTER — Encounter: Payer: Self-pay | Admitting: Gastroenterology

## 2022-04-05 LAB — SURGICAL PATHOLOGY

## 2022-04-06 ENCOUNTER — Other Ambulatory Visit: Payer: Self-pay | Admitting: Gastroenterology

## 2022-04-06 DIAGNOSIS — K5010 Crohn's disease of large intestine without complications: Secondary | ICD-10-CM

## 2022-04-11 ENCOUNTER — Ambulatory Visit
Admission: RE | Admit: 2022-04-11 | Discharge: 2022-04-11 | Disposition: A | Discharge status: Home / Self Care | Payer: Medicare Other | Source: Ambulatory Visit | Attending: Gastroenterology | Admitting: Gastroenterology

## 2022-04-11 DIAGNOSIS — K5010 Crohn's disease of large intestine without complications: Secondary | ICD-10-CM | POA: Insufficient documentation

## 2022-04-11 MED ORDER — IOHEXOL 300 MG/ML  SOLN
100.0000 mL | Freq: Once | INTRAMUSCULAR | Status: AC | PRN
  Administered 2022-04-11: 100 mL via INTRAVENOUS

## 2023-08-03 ENCOUNTER — Other Ambulatory Visit: Payer: Self-pay | Admitting: Gastroenterology

## 2023-08-03 DIAGNOSIS — K5000 Crohn's disease of small intestine without complications: Secondary | ICD-10-CM

## 2023-08-11 ENCOUNTER — Ambulatory Visit
Admission: RE | Admit: 2023-08-11 | Discharge: 2023-08-11 | Disposition: A | Discharge status: Home / Self Care | Payer: Medicare Other | Source: Ambulatory Visit | Attending: Gastroenterology | Admitting: Gastroenterology

## 2023-08-11 DIAGNOSIS — K5000 Crohn's disease of small intestine without complications: Secondary | ICD-10-CM | POA: Insufficient documentation

## 2023-08-11 MED ORDER — IOHEXOL 300 MG/ML  SOLN
100.0000 mL | Freq: Once | INTRAMUSCULAR | Status: AC | PRN
Start: 2023-08-11 — End: 2023-08-11
  Administered 2023-08-11: 100 mL via INTRAVENOUS
